# Patient Record
Sex: Female | Born: 1960 | Race: White | Hispanic: Yes | Marital: Married | Smoking: Never smoker
Health system: Southern US, Community
[De-identification: ages and names within clinical notes are randomized; demographics above are authoritative.]

## PROBLEM LIST (undated history)

## (undated) ENCOUNTER — Emergency Department (HOSPITAL_COMMUNITY): Admission: EM | Payer: Self-pay | Source: Home / Self Care

## (undated) DIAGNOSIS — E669 Obesity, unspecified: Secondary | ICD-10-CM

## (undated) DIAGNOSIS — I10 Essential (primary) hypertension: Secondary | ICD-10-CM

## (undated) DIAGNOSIS — B029 Zoster without complications: Secondary | ICD-10-CM

## (undated) HISTORY — PX: ABDOMINAL HYSTERECTOMY: SHX81

## (undated) HISTORY — PX: OVARIAN CYST REMOVAL: SHX89

## (undated) HISTORY — PX: APPENDECTOMY: SHX54

---

## 2000-05-18 ENCOUNTER — Encounter: Payer: Self-pay | Admitting: Emergency Medicine

## 2000-05-18 ENCOUNTER — Emergency Department (HOSPITAL_COMMUNITY): Admission: EM | Admit: 2000-05-18 | Discharge: 2000-05-18 | Payer: Self-pay | Admitting: Emergency Medicine

## 2000-05-25 ENCOUNTER — Emergency Department (HOSPITAL_COMMUNITY): Admission: EM | Admit: 2000-05-25 | Discharge: 2000-05-25 | Payer: Self-pay | Admitting: Emergency Medicine

## 2000-05-25 ENCOUNTER — Encounter: Payer: Self-pay | Admitting: Emergency Medicine

## 2001-03-15 ENCOUNTER — Emergency Department (HOSPITAL_COMMUNITY): Admission: EM | Admit: 2001-03-15 | Discharge: 2001-03-15 | Payer: Self-pay | Admitting: Emergency Medicine

## 2001-03-15 ENCOUNTER — Encounter: Payer: Self-pay | Admitting: Emergency Medicine

## 2002-05-19 ENCOUNTER — Emergency Department (HOSPITAL_COMMUNITY): Admission: EM | Admit: 2002-05-19 | Discharge: 2002-05-19 | Payer: Self-pay | Admitting: Emergency Medicine

## 2002-05-19 ENCOUNTER — Encounter: Payer: Self-pay | Admitting: Emergency Medicine

## 2004-01-05 ENCOUNTER — Emergency Department (HOSPITAL_COMMUNITY): Admission: EM | Admit: 2004-01-05 | Discharge: 2004-01-05 | Payer: Self-pay | Admitting: Emergency Medicine

## 2005-08-07 ENCOUNTER — Emergency Department (HOSPITAL_COMMUNITY): Admission: EM | Admit: 2005-08-07 | Discharge: 2005-08-08 | Payer: Self-pay | Admitting: *Deleted

## 2005-08-08 ENCOUNTER — Inpatient Hospital Stay (HOSPITAL_COMMUNITY): Admission: AD | Admit: 2005-08-08 | Discharge: 2005-08-08 | Payer: Self-pay | Admitting: Obstetrics and Gynecology

## 2005-08-09 ENCOUNTER — Ambulatory Visit (HOSPITAL_COMMUNITY): Admission: RE | Admit: 2005-08-09 | Discharge: 2005-08-09 | Payer: Self-pay | Admitting: Obstetrics and Gynecology

## 2005-08-09 ENCOUNTER — Ambulatory Visit: Payer: Self-pay | Admitting: Obstetrics and Gynecology

## 2005-08-20 ENCOUNTER — Encounter (INDEPENDENT_AMBULATORY_CARE_PROVIDER_SITE_OTHER): Payer: Self-pay | Admitting: Specialist

## 2005-08-20 ENCOUNTER — Inpatient Hospital Stay (HOSPITAL_COMMUNITY): Admission: RE | Admit: 2005-08-20 | Discharge: 2005-08-24 | Payer: Self-pay | Admitting: Obstetrics and Gynecology

## 2005-08-20 ENCOUNTER — Ambulatory Visit: Payer: Self-pay | Admitting: Obstetrics and Gynecology

## 2005-08-25 ENCOUNTER — Inpatient Hospital Stay (HOSPITAL_COMMUNITY): Admission: AD | Admit: 2005-08-25 | Discharge: 2005-08-25 | Payer: Self-pay | Admitting: Obstetrics & Gynecology

## 2005-08-27 ENCOUNTER — Observation Stay (HOSPITAL_COMMUNITY): Admission: AD | Admit: 2005-08-27 | Discharge: 2005-08-28 | Payer: Self-pay | Admitting: *Deleted

## 2005-08-27 ENCOUNTER — Ambulatory Visit: Payer: Self-pay | Admitting: Family Medicine

## 2005-09-04 ENCOUNTER — Ambulatory Visit: Payer: Self-pay | Admitting: Obstetrics and Gynecology

## 2005-10-02 ENCOUNTER — Ambulatory Visit: Payer: Self-pay | Admitting: Obstetrics and Gynecology

## 2005-10-16 ENCOUNTER — Ambulatory Visit: Payer: Self-pay | Admitting: Obstetrics and Gynecology

## 2006-03-19 ENCOUNTER — Ambulatory Visit: Payer: Self-pay | Admitting: Internal Medicine

## 2006-03-19 ENCOUNTER — Ambulatory Visit: Payer: Self-pay | Admitting: *Deleted

## 2008-03-21 ENCOUNTER — Emergency Department (HOSPITAL_COMMUNITY): Admission: EM | Admit: 2008-03-21 | Discharge: 2008-03-21 | Payer: Self-pay | Admitting: Emergency Medicine

## 2010-08-11 ENCOUNTER — Emergency Department (HOSPITAL_COMMUNITY): Admission: EM | Admit: 2010-08-11 | Discharge: 2010-08-11 | Payer: Self-pay | Admitting: Emergency Medicine

## 2011-04-24 NOTE — Op Note (Signed)
NAMEJake Jenkins           ACCOUNT NO.:  [id number]   MEDICAL RECORD NO.:  [id number]          PATIENT TYPE:  INP   LOCATION:  9315                          [facility]ACILITY:  [facility]   PHYSICIAN:  Phil D. Okey Dupre, M.D.     DATE OF BIRTH:  01/20/1963   DATE OF PROCEDURE:  08/20/2005  DATE OF DISCHARGE:                                 OPERATIVE REPORT   PROCEDURE:  Exploratory laparotomy with lysis of massive pelvic adhesions  and excision of pelvic complex cyst. Also repair of sigmoid colon defect by  Dr. Orson Slick which will be dictated by him.   PREOPERATIVE DIAGNOSIS:  Complex left ovarian cyst with normal right ovary.   POSTOPERATIVE DIAGNOSIS:  Massive pelvic adhesions, multiloculated complex  pelvic cyst, and inadvertent entry into the sigmoid colon.   SURGEON:  Javier Glazier. Okey Dupre, M.D. (with bowel repair by Lebron Conners, M.D.)   FIRST ASSISTANT:  Debbrah Alar, M.D.   ANESTHESIA:  General.   ESTIMATED BLOOD LOSS:  150 mL   POSTOPERATIVE CONDITION:  Satisfactory.   SPECIMEN TO PATHOLOGY:  Pelvic cyst and cyst walls.   OPERATIVE FINDINGS:  This patient who has undergone several laparotomies in  the past and finally hysterectomy for pelvic abscesses, who presented with  severe chronic pelvic pain with severe exacerbation and found to have a left  ovarian cyst complex by ultrasound. She was found to have massive pelvic  adhesions of bowel to bowel, bowel to omentum and bowel to cyst with a large  10 x 10 x 10 cystic mass extending from just below the bladder to the sacral  area and seen to be situated more to the right than to the left. During the  procedure we inadvertently entered the sigmoid colon with a rent about 2 cm.   DESCRIPTION OF PROCEDURE:  Under satisfactory general anesthesia the patient  in dorsal supine position, Foley catheter in urinary bladder, abdomen was  prepped and draped in the usual sterile manner and entered through a  Pfannenstiel incision. On entry  into the peritoneal cavity we found that  there were thick and massive adhesions to the anterior abdominal for which  we needed more exposure, so the rectus muscles were trimmed and the  peritoneum opened transversely as well as already opened vertically. Sharp  and blunt dissection was used in the beginning to try and isolate this  pelvic mass that was cystic, blue in coloration on the right and seemed to  be separated almost into two separate cysts, identified ovaries at this  point. So tediously by sharp and blunt dissection, the bowel and omentum was  dissected away from the thick adhesions attached to the pelvic mass. Once  this was freed up about 7 x 5 cm in diameter, the larger portion which was  thickly adhered to the smaller one seemed to be about 10 x 10 x 10. During  our dissection we sprang a small leak in the larger mass. Clear straw-  colored fluid. It was therefore decided to suck out the rest as dissection  had become so difficult going around injury that we made a  small incision in  the top of it and sucked, which measured approximately 300 mL. We then  removed the small cystic portion and sent for pathology the cystic wall. We  never did identify the infundibulopelvic ligaments nor either ovary after  the masses were removed, mainly because of the bowel that was appeared to  the pelvic walls. Therefore we are not sure whether this was truly ovarian.  If it was it would most likely be from the right-side and not the left as  described by an ultrasound. In examining the surgical site, we saw small  rent of 2 cm vertical in the sigmoid colon.  We got a Development worker, international aid, Dr.  Orson Slick and he kindly closed the rent for [place]. There was some small oozing in  the pelvis but no active pumping and seemed quite dry at the end of the  procedure. At this point, tape, instrument, sponge and needle counts  reported correct and the incision was closed with a typical Smead-Jones  closure using  0 PDS suture with a loop. Subcutaneous bleeders were  controlled with hot cautery. Each level was irrigated on exit. Nasogastric  tube was placed in the patient prior to her exiting the table. Skin edges  were approximated with skin staples. A dry sterile dressing was applied. The  patient tolerated the procedure well with approximately 150 mL blood loss  and was transferred recovery room in satisfactory condition.           ______________________________  Javier Glazier Okey Dupre, M.D.     PDR/MEDQ  D:  08/20/2005  T:  08/21/2005  Job:  [id number]

## 2011-04-24 NOTE — Group Therapy Note (Signed)
[name] NO.:  [id number]   MEDICAL RECORD NO.:  [id number]          PATIENT TYPE:  WOC   LOCATION:  [facility]                   FACILITY:  [facility]   PHYSICIAN:  Argentina Donovan, MD        DATE OF BIRTH:  01/20/1963   DATE OF SERVICE:  08/09/2005                                    CLINIC NOTE   REASON FOR VISIT:  This is a patient that was sent to [place] by Dr. Tresa Res. She  is a 50 year old Hispanic nulligravida who has a history of total abdominal  hysterectomy after exploratory laparotomy and pelvic abscess. This was done  in 1986. She has been in fairly good health but is on no medication, and was  seen in the emergency room because of abdominal pain, diagnosed with a 15 cm  complex ovarian cyst on the left. We have discussed with the patient and she  desires both ovaries removed if possible. The patient has no other medical  problems noted, has no allergies, and is on no medication on a regular basis  except the Percocet that she has been on since being seen in the emergency  room 2 days ago. She is 5 feet tall, weighs 175 pounds. She has a blood  pressure 166/101, a temperature of 99.2, and 79 pulse.   We are going to schedule the patient for exploratory laparotomy, bilateral  oophorectomy. We will get a comprehensive and a basic metabolic panel today,  as well as a CA-125, and we will schedule the patient as soon as possible  for surgery. I am putting her on Percocet 5 mg #50. We have encouraged her  to supplement that with ibuprofen.   IMPRESSION:  Abdominal pain secondary to large left adnexal mass.           ______________________________  Argentina Donovan, MD     PR/MEDQ  D:  08/09/2005  T:  08/10/2005  Job:  [id number]

## 2011-04-24 NOTE — Discharge Summary (Signed)
NAMEJake Michaelis           ACCOUNT NO.:  [id number]   MEDICAL RECORD NO.:  [id number]          PATIENT TYPE:   LOCATION:                                 FACILITY:   PHYSICIAN:  Tanya S. Shawnie Pons, M.D.        DATE OF BIRTH:   DATE OF ADMISSION:  08/27/2005  DATE OF DISCHARGE:  08/28/2005                                 DISCHARGE SUMMARY   ADMISSION DIAGNOSES:  1.  Abdominal pain.  2.  Oozing serosanguineous fluid from the incision.  3.  Anemia.   DISCHARGE DIAGNOSES:  1.  Abdominal pain.  2.  Serosanguineous fluid oozing from abdominal, incision.  3.  Anemia.   DISCHARGE MEDICATIONS:  1.  Ciprofloxacin (500 mg) 1 tablet p.o. q.12h.  2.  Metronidazole (500 mg) 1 tablet p.o. q.12h.  3.  Xanax (0.5 mg) 1 tablet p.o. q.h.s. to sleep.  4.  Ibuprofen (800 mg) 1 tablet p.o. q.8h. p.r.n. pain.  5.  Colace (100 mg) 1 tablet p.o. b.i.d. p.r.n. constipation.  6.  Iron (325 mg) 1 tablet p.o. b.i.d.   FOLLOW UP APPOINTMENTS:  The patient has a follow-up appointment with Dr.  Okey Dupre at [facility] on September 04, 2005.  The patient also  has a follow-up appointment at [facility] on August 29, 2005.   INSTRUCTIONS:  1.  Increase vegetables, fruits, and fluid intake to improve constipation.  2.  Keep the wound clean with peroxide once a day.   BRIEF HISTORY, PHYSICAL EXAMINATION, AND LABORATORY DATA ON ADMISSION:  A 50-  year-old Hispanic female postoperative day #8 exploratory laparoscopy with  lysis of adhesions, repair of colon sigmoid, removal of ovarian cyst came to  the maternal admission unit complaining of abdominal pain and an increase of  leaking fluids from the incision.  This patient was also seen on August 25, 2005, at the maternal admission unit complaining of abdominal pain and  constipation.  An abdominal CT scan was performed on August 25, 2005,  which showed no changes since exploratory laparoscopy, no amount of fluid  in  the right paracolic gutter, otherwise unremarkable CT examination of the  abdomen.  A pelvis CT also performed on August 25, 2005, showed several  areas of soft tissue thickening, possibly secondary to postoperative  hematoma or tumor.  There were no abscesses.  There were a few dots of free  air, which were likely postoperative.  The inflammatory changes involving  the sigmoid colon were observed and these were secondary to surrounding  edema in the fat.  On August 25, 2005, the patient was afebrile.  Abdominal pain resolved with enema since the patient was unable to move her  bowels for approximately 2 to 3 days.  On the day of admission, the patient  complained of increased abdominal pain with leaking fluid from incision and  low-grade fever.  The patient denied nausea or vomiting.  Physical  examination on admission showed temperature of 100.1, pulse of 97,  respiratory rate of 20, blood pressure of 89/62.  In general, the patient  was awake and alert.  Physical examination was unremarkable except for  abdominal exam that showed oozing serosanguineous fluid from the low  abdominal incisions.  Edema and hematoma on the incision are noted.  Abdomen  was diffusely tender, +/- rebound, soft, moderately distended.  Laboratory  data on admission showed WBC of 14.7, hemoglobin of 8.8, hematocrit of  25.9%, platelets 523,000.  BMP:  Sodium 137, potassium 3, chloride 91, CO2  29, BUN 6, creatinine 0.8, glucose 136.  SGOT 25, SGPT 27.  Calcium 8.5,  magnesium 2.3.  the patient was admitted for 23-hour observation.   HOSPITAL COURSE:  1.  Abdominal pain:  X-ray of the abdomen ruled out obstruction or fre air.      Abdominal pain was improved with bowel movement.  Abdominal pain was      most likely secondary to constipation and adhesions/scar tissue      secondary to multiple abdominal surgical procedures.  By the day of      discharge, the patient is clinically stable, afebrile, and  denying      abdominal pain, nausea, or vomiting.  The patient was instructed to      increase vegetables, fruits, and fluid intake to avoid constipation.      The patient was also prescribed Colace p.r.n. constipation.  The patient      is to continue Cipro 500 mg 1 tablet p.o. b.i.d. and Flagyl 500 mg 1      tablet p.o. b.i.d. to complete a course of 7 days.  This medication was      started on August 25, 2005.  The patient has a follow-up appointment      with Dr. Okey Dupre on September 04, 2005.  2.  Serosanguineous oozing fluid from incisions:  This is likely secondary      to hematoma postoperatively.  Dr. Okey Dupre examined the patient and      explained that this oozing will continue for several days secondary to      some edema and hematoma infiltration of the incision area.  The patient      is to keep the incisions clean with peroxide once a day.  3.  Anemia:  Secondary to blood loss during exploratory laparoscopy.      Continue iron 325 mg 1 tablet p.o. b.i.d.  4.  Mental health:  The patient has a follow-up appointment at [facility] secondary to hearing voices.  This issue was      assessed during the visit to the maternal admission unit on August 25, 2005.  Behavioral health was consulted and the patient was      evaluated.  The patient is to be followed as an outpatient at [facility].  Currently, the patient is on Xanax 0.5 mg 1 tablet p.o. q.h.s.      to sleep.  The patient does have a history of suicidal attempt 1 year      ago and she also used to hear voices 1 year ago.  During the      hospitalization, the patient stated that the voices are not bothering      her as much as they did on August 25, 2005; although she still      continued to hear the voices.  Voices are not giving her commands.  The      patient denies suicidal thoughts/plans.      Henri Medal, MD  ______________________________  Shelbie Proctor Shawnie Pons,  M.D.    Lendon Colonel  D:  08/28/2005  T:  08/28/2005  Job:  [id number]

## 2011-04-24 NOTE — Op Note (Signed)
[name] NO.:  [id number]   MEDICAL RECORD NO.:  [id number]          PATIENT TYPE:  INP   LOCATION:  9315                          [facility]ILITY[facility]   PHYSICIAN:  Lorre Munroe., M.D.DATE OF BIRTH:  01/20/1963   DATE OF PROCEDURE:  08/20/2005  DATE OF DISCHARGE:                                 OPERATIVE REPORT   CLINICAL NOTE:  The patient is a 50 year old female who is undergoing  resection of a cystic pelvic mass by Dr. Okey Dupre.  There were dense adhesions  involving the bowel.  After resection, Dr. Okey Dupre noted a hole in the anterior  rectum.  The patient is not known to have any chronic gastrointestinal  problems.  There has been no fecal contamination noted by Dr. Okey Dupre prior to  my coming to the operating room.   DESCRIPTION OF PROCEDURE:  I scrubbed at the request of Dr. Okey Dupre and noted  the area which he had marked with Babcock clamp as representing perforation.  There was indeed a perforation of the anterior surface of the rectosigmoid  about 2-3 cm in diameter.  There was a very clean opening with fresh-  appearing, well-vascularized edges of the bowel.  The caliber of the bowel  was large at that point.  There was no evidence of any fecal contamination  whatsoever and the amount of stool in the rectum and proximal bowel was  minimal.  There was no distention of the bowel.  Distally, the pelvis was  pretty much obliterated by adhesions.  The bladder flap appeared to be  intact and Foley balloon could be palpated in the bladder.  No bleeding was  evident.  I have decided that with this appearance that a primary repair was  warranted.   I closed the hole transversely by full-thickness, running 3-0 Vicryl suture  and noted that that produced no evident stenosis of the bowel.  I then  reinforced that with Lembert 3-0 silk sutures through the seromuscular  layers of the bowel all the way across.  Again, I noted that good generous  lumen of the rectum  was maintained.  After irrigation and checking for  hemostasis, I scrubbed out and Dr. Okey Dupre completed the operation.      Lorre Munroe., M.D.  Electronically Signed     WB/MEDQ  D:  08/21/2005  T:  08/21/2005  Job:  [id number]   cc:   Javier Glazier. Okey Dupre, M.D.  Fax: [phone]

## 2011-04-24 NOTE — Discharge Summary (Signed)
NAMEJake Michaelis           ACCOUNT NO.:  [id number]   MEDICAL RECORD NO.:  [id number]          PATIENT TYPE:  INP   LOCATION:  9315                          [facility]   PHYSICIAN:  Phil D. Okey Dupre, M.D.     DATE OF BIRTH:  01/20/1963   DATE OF ADMISSION:  08/20/2005  DATE OF DISCHARGE:  08/24/2005                                 DISCHARGE SUMMARY   On the day of admission the patient was underwent exploratory laparotomy  with lysis of massive pelvic conditions and bilateral oophorectomy.  During  the procedure with the massive adhesions, the colon was inadvertently  entered and the general surgeon came over to repair the 2 cm opening in the  colon.  The patient recovered without significant problems related to the  surgery.  She has multiple complaints which she had prior to surgery, to  spastic colon, psychological complaints possibly related to her family  situation.  We underwent long consultations with her with a translator  present.  She was discharged to be followed up in two weeks in the GYN  clinic and referred to [facility] for psychological analysis and  treatment.  The patient had a relatively unremarkable postoperative course,  had vital signs stable.  Was given discharge instructions as to diet,  activity and follow-up, and has been discharged on Percocet.           ______________________________  Javier Glazier. Okey Dupre, M.D.     PDR/MEDQ  D:  11/07/2005  T:  11/07/2005  Job:  [id number]

## 2011-08-11 ENCOUNTER — Ambulatory Visit (INDEPENDENT_AMBULATORY_CARE_PROVIDER_SITE_OTHER): Payer: Self-pay

## 2011-08-11 ENCOUNTER — Inpatient Hospital Stay (INDEPENDENT_AMBULATORY_CARE_PROVIDER_SITE_OTHER)
Admission: RE | Admit: 2011-08-11 | Discharge: 2011-08-11 | Disposition: A | Discharge status: Home / Self Care | Payer: Self-pay | Source: Ambulatory Visit | Attending: Emergency Medicine | Admitting: Emergency Medicine

## 2011-08-11 DIAGNOSIS — IMO0002 Reserved for concepts with insufficient information to code with codable children: Secondary | ICD-10-CM

## 2011-08-11 DIAGNOSIS — S20229A Contusion of unspecified back wall of thorax, initial encounter: Secondary | ICD-10-CM

## 2011-08-11 DIAGNOSIS — M799 Soft tissue disorder, unspecified: Secondary | ICD-10-CM

## 2011-08-11 LAB — POCT URINALYSIS DIP (DEVICE)
Hgb urine dipstick: NEGATIVE
Ketones, ur: NEGATIVE mg/dL
Protein, ur: NEGATIVE mg/dL
Specific Gravity, Urine: 1.02 (ref 1.005–1.030)
pH: 7 (ref 5.0–8.0)

## 2013-01-29 ENCOUNTER — Other Ambulatory Visit: Payer: Self-pay

## 2013-01-29 ENCOUNTER — Emergency Department (HOSPITAL_COMMUNITY): Payer: Self-pay

## 2013-01-29 ENCOUNTER — Inpatient Hospital Stay (HOSPITAL_COMMUNITY)
Admission: EM | Admit: 2013-01-29 | Discharge: 2013-02-02 | DRG: 202 | Disposition: A | Discharge status: Home / Self Care | Payer: MEDICAID | Attending: Internal Medicine | Admitting: Internal Medicine

## 2013-01-29 ENCOUNTER — Inpatient Hospital Stay (HOSPITAL_COMMUNITY): Payer: Self-pay

## 2013-01-29 ENCOUNTER — Encounter (HOSPITAL_COMMUNITY): Payer: Self-pay

## 2013-01-29 DIAGNOSIS — R0609 Other forms of dyspnea: Secondary | ICD-10-CM | POA: Diagnosis present

## 2013-01-29 DIAGNOSIS — E669 Obesity, unspecified: Secondary | ICD-10-CM | POA: Diagnosis present

## 2013-01-29 DIAGNOSIS — R071 Chest pain on breathing: Secondary | ICD-10-CM | POA: Diagnosis present

## 2013-01-29 DIAGNOSIS — J209 Acute bronchitis, unspecified: Principal | ICD-10-CM | POA: Diagnosis present

## 2013-01-29 DIAGNOSIS — J40 Bronchitis, not specified as acute or chronic: Secondary | ICD-10-CM

## 2013-01-29 DIAGNOSIS — I509 Heart failure, unspecified: Secondary | ICD-10-CM

## 2013-01-29 DIAGNOSIS — R9401 Abnormal electroencephalogram [EEG]: Secondary | ICD-10-CM | POA: Diagnosis present

## 2013-01-29 DIAGNOSIS — J189 Pneumonia, unspecified organism: Secondary | ICD-10-CM | POA: Diagnosis present

## 2013-01-29 DIAGNOSIS — R0989 Other specified symptoms and signs involving the circulatory and respiratory systems: Secondary | ICD-10-CM | POA: Diagnosis present

## 2013-01-29 DIAGNOSIS — R079 Chest pain, unspecified: Secondary | ICD-10-CM

## 2013-01-29 DIAGNOSIS — E785 Hyperlipidemia, unspecified: Secondary | ICD-10-CM

## 2013-01-29 DIAGNOSIS — R062 Wheezing: Secondary | ICD-10-CM

## 2013-01-29 DIAGNOSIS — M79609 Pain in unspecified limb: Secondary | ICD-10-CM

## 2013-01-29 DIAGNOSIS — R0602 Shortness of breath: Secondary | ICD-10-CM

## 2013-01-29 DIAGNOSIS — M25569 Pain in unspecified knee: Secondary | ICD-10-CM

## 2013-01-29 DIAGNOSIS — I10 Essential (primary) hypertension: Secondary | ICD-10-CM

## 2013-01-29 HISTORY — DX: Obesity, unspecified: E66.9

## 2013-01-29 HISTORY — DX: Essential (primary) hypertension: I10

## 2013-01-29 LAB — BASIC METABOLIC PANEL
BUN: 11 mg/dL (ref 6–23)
Creatinine, Ser: 0.65 mg/dL (ref 0.50–1.10)

## 2013-01-29 LAB — LIPID PANEL
HDL: 55 mg/dL (ref >39)
LDL Cholesterol: 160 mg/dL — ABNORMAL HIGH (ref 0–99)
Total CHOL/HDL Ratio: 4.2 RATIO
VLDL: 17 mg/dL (ref 0–40)

## 2013-01-29 LAB — URINE MICROSCOPIC-ADD ON

## 2013-01-29 LAB — TROPONIN I: Troponin I: <0.3 ng/mL (ref <0.30)

## 2013-01-29 LAB — CBC
HCT: 41.2 % (ref 36.0–46.0)
MCHC: 35.4 g/dL (ref 30.0–36.0)
MCV: 82.6 fL (ref 78.0–100.0)
RDW: 12.8 % (ref 11.5–15.5)

## 2013-01-29 LAB — URINALYSIS, ROUTINE W REFLEX MICROSCOPIC
Glucose, UA: NEGATIVE mg/dL
Leukocytes, UA: NEGATIVE
Protein, ur: 100 mg/dL — AB
Specific Gravity, Urine: 1.021 (ref 1.005–1.030)

## 2013-01-29 LAB — PRO B NATRIURETIC PEPTIDE: Pro B Natriuretic peptide (BNP): 864.2 pg/mL — ABNORMAL HIGH (ref 0–125)

## 2013-01-29 LAB — POCT I-STAT TROPONIN I: Troponin i, poc: 0.03 ng/mL (ref 0.00–0.08)

## 2013-01-29 MED ORDER — PREDNISONE 20 MG PO TABS
60.0000 mg | ORAL_TABLET | Freq: Once | ORAL | Status: AC
  Administered 2013-01-29: 60 mg via ORAL
  Filled 2013-01-29: qty 3

## 2013-01-29 MED ORDER — HEPARIN BOLUS VIA INFUSION
4000.0000 [IU] | Freq: Once | INTRAVENOUS | Status: AC
  Administered 2013-01-29: 4000 [IU] via INTRAVENOUS
  Filled 2013-01-29: qty 4000

## 2013-01-29 MED ORDER — ALBUTEROL SULFATE (5 MG/ML) 0.5% IN NEBU
5.0000 mg | INHALATION_SOLUTION | RESPIRATORY_TRACT | Status: DC
  Filled 2013-01-29: qty 0.5

## 2013-01-29 MED ORDER — OXYCODONE-ACETAMINOPHEN 5-325 MG PO TABS
1.0000 | ORAL_TABLET | ORAL | Status: DC | PRN
  Administered 2013-01-29: 2 via ORAL
  Administered 2013-01-31 – 2013-02-01 (×2): 1 via ORAL
  Filled 2013-01-29 (×2): qty 1
  Filled 2013-01-29 (×2): qty 2

## 2013-01-29 MED ORDER — ALBUTEROL SULFATE (5 MG/ML) 0.5% IN NEBU
5.0000 mg | INHALATION_SOLUTION | Freq: Once | RESPIRATORY_TRACT | Status: AC
  Administered 2013-01-29: 5 mg via RESPIRATORY_TRACT
  Filled 2013-01-29: qty 1

## 2013-01-29 MED ORDER — ALBUTEROL (5 MG/ML) CONTINUOUS INHALATION SOLN
10.0000 mg/h | INHALATION_SOLUTION | Freq: Once | RESPIRATORY_TRACT | Status: AC
  Administered 2013-01-29: 10 mg/h via RESPIRATORY_TRACT
  Filled 2013-01-29: qty 20

## 2013-01-29 MED ORDER — ASPIRIN 81 MG PO CHEW
324.0000 mg | CHEWABLE_TABLET | Freq: Once | ORAL | Status: AC
  Administered 2013-01-29: 324 mg via ORAL
  Filled 2013-01-29: qty 4

## 2013-01-29 MED ORDER — POTASSIUM CHLORIDE CRYS ER 20 MEQ PO TBCR
40.0000 meq | EXTENDED_RELEASE_TABLET | Freq: Once | ORAL | Status: AC
  Administered 2013-01-29: 40 meq via ORAL
  Filled 2013-01-29: qty 2

## 2013-01-29 MED ORDER — HEPARIN (PORCINE) IN NACL 100-0.45 UNIT/ML-% IJ SOLN
900.0000 [IU]/h | INTRAMUSCULAR | Status: DC
  Administered 2013-01-29: 900 [IU]/h via INTRAVENOUS
  Filled 2013-01-29: qty 250

## 2013-01-29 MED ORDER — IPRATROPIUM BROMIDE 0.02 % IN SOLN
0.5000 mg | Freq: Once | RESPIRATORY_TRACT | Status: AC
  Administered 2013-01-29: 0.5 mg via RESPIRATORY_TRACT
  Filled 2013-01-29: qty 2.5

## 2013-01-29 MED ORDER — HYDRALAZINE HCL 20 MG/ML IJ SOLN
10.0000 mg | INTRAMUSCULAR | Status: DC | PRN
  Filled 2013-01-29 (×2): qty 0.5

## 2013-01-29 MED ORDER — POTASSIUM CHLORIDE CRYS ER 20 MEQ PO TBCR
40.0000 meq | EXTENDED_RELEASE_TABLET | Freq: Two times a day (BID) | ORAL | Status: AC
  Administered 2013-01-29 – 2013-01-30 (×2): 40 meq via ORAL
  Filled 2013-01-29 (×2): qty 2

## 2013-01-29 MED ORDER — ENOXAPARIN SODIUM 40 MG/0.4ML ~~LOC~~ SOLN
40.0000 mg | SUBCUTANEOUS | Status: DC
  Filled 2013-01-29: qty 0.4

## 2013-01-29 MED ORDER — ONDANSETRON HCL 4 MG PO TABS: 4.0000 mg | ORAL_TABLET | Freq: Four times a day (QID) | ORAL | Status: DC | PRN

## 2013-01-29 MED ORDER — ONDANSETRON HCL 4 MG/2ML IJ SOLN: 4.0000 mg | Freq: Four times a day (QID) | INTRAMUSCULAR | Status: DC | PRN

## 2013-01-29 MED ORDER — ACETAMINOPHEN 650 MG RE SUPP: 650.0000 mg | Freq: Four times a day (QID) | RECTAL | Status: DC | PRN

## 2013-01-29 MED ORDER — HYDROCHLOROTHIAZIDE 12.5 MG PO CAPS
12.5000 mg | ORAL_CAPSULE | Freq: Every day | ORAL | Status: DC
  Filled 2013-01-29: qty 1

## 2013-01-29 MED ORDER — SODIUM CHLORIDE 0.9 % IJ SOLN
3.0000 mL | Freq: Two times a day (BID) | INTRAMUSCULAR | Status: DC
  Administered 2013-01-29 – 2013-02-02 (×8): 3 mL via INTRAVENOUS

## 2013-01-29 MED ORDER — IPRATROPIUM BROMIDE 0.02 % IN SOLN
0.5000 mg | RESPIRATORY_TRACT | Status: DC
  Filled 2013-01-29: qty 2.5

## 2013-01-29 MED ORDER — FUROSEMIDE 10 MG/ML IJ SOLN
40.0000 mg | Freq: Once | INTRAMUSCULAR | Status: AC
  Administered 2013-01-29: 40 mg via INTRAVENOUS
  Filled 2013-01-29: qty 4

## 2013-01-29 MED ORDER — ENOXAPARIN SODIUM 40 MG/0.4ML ~~LOC~~ SOLN
40.0000 mg | SUBCUTANEOUS | Status: DC
  Administered 2013-01-30 – 2013-02-02 (×4): 40 mg via SUBCUTANEOUS
  Filled 2013-01-29 (×4): qty 0.4

## 2013-01-29 MED ORDER — LEVOFLOXACIN IN D5W 500 MG/100ML IV SOLN
500.0000 mg | Freq: Once | INTRAVENOUS | Status: AC
  Administered 2013-01-29: 500 mg via INTRAVENOUS
  Filled 2013-01-29: qty 100

## 2013-01-29 MED ORDER — ACETAMINOPHEN 325 MG PO TABS
650.0000 mg | ORAL_TABLET | Freq: Four times a day (QID) | ORAL | Status: DC | PRN
  Administered 2013-01-31 – 2013-02-02 (×3): 650 mg via ORAL
  Filled 2013-01-29 (×3): qty 2

## 2013-01-29 MED ORDER — ATORVASTATIN CALCIUM 80 MG PO TABS
80.0000 mg | ORAL_TABLET | Freq: Every day | ORAL | Status: DC
  Administered 2013-01-29 – 2013-02-01 (×4): 80 mg via ORAL
  Filled 2013-01-29 (×5): qty 1

## 2013-01-29 NOTE — ED Provider Notes (Signed)
CC of SOB, has had for 4 days, gradually worsening, no sig PMH and doesn't see a doctor.  On exam pt has diffuse wheezing and increased WOB, given nebs in ED with improvement, no obvious infiltrate on CXR, admitted to unassigned medicine.  Medical screening examination/treatment/procedure(s) were conducted as a shared visit with non-physician practitioner(s) and myself.  I personally evaluated the patient during the encounter    Vida Roller, MD 01/29/13 1434

## 2013-01-29 NOTE — H&P (Addendum)
Internal Medicine Attending Admission Note Date: 01/29/2013  Patient name: Felicia Jenkins Medical record number: [id number] Date of birth: 1961/10/18 Age: 52 y.o. Gender: female  I saw and evaluated the patient. I reviewed the resident's note and I agree with the resident's findings and plan as documented in the resident's note.  Chief Complaint(s): Chest pain, knee pain and shortness of breath  History - key components related to admission: Patient is a 52 year old Spanish-speaking woman with no previous medical history. Most of the history has been collected from the chart and from the residents who presented the patient to me as my communication was limited by the language barrier.  She did complain of chest pain to me which was much less than what she came in with. She described the pain as present in the middle of the chest, sharp in character, 2-3/10 at this time, no radiation, worse with walking and improved with rest. Chest pain was associated with shortness of breath which has progressively been getting worse over last 2-3 days. Patient is unable to lie flat at night as lying flat makes the shortness of breath worse.  Patient also complained of left knee pain which started on Sunday.  15 point review of systems was otherwise negative.  Physical Exam - key components related to admission:  Filed Vitals:   01/29/13 1000 01/29/13 1100 01/29/13 1154 01/29/13 1305  BP: 169/83 160/97 144/57 157/62  Pulse: 101 103 81 79  Temp:    98.5 F (36.9 C)  TempSrc:    Oral  Resp: 22 18 14 20   Height:    5\' 2"  (1.575 m)  Weight:    217 lb 9.5 oz (98.7 kg)  SpO2: 100% 97% 98% 98%   Physical Exam: General: Vital signs reviewed and noted. Well-developed obese abdomen, in no acute distress; alert, appropriate and cooperative throughout examination.  Head: Normocephalic, atraumatic.  Eyes: PERRL, EOMI, No signs of anemia or jaundince.  Nose: Mucous membranes moist, not inflammed,  nonerythematous.  Throat: Oropharynx nonerythematous, no exudate appreciated.   Neck: No deformities, masses, or tenderness noted.Supple, No carotid Bruits, no JVD.  Lungs:  Normal respiratory effort. Clear to auscultation BL without crackles or wheezes.  Heart: RRR. S1 and S2 normal without gallop, murmur, or rubs.  Abdomen:  BS normoactive. Soft, Nondistended, non-tender.  No masses or organomegaly.  Extremities: No pretibial edema.left knee tender to touch but no swellings/redness noted   Neurologic: A&O X3, CN II - XII are grossly intact. Motor strength is 5/5 in the all 4 extremities, Sensations intact to light touch, Cerebellar signs negative.  Skin: No visible rashes, scars.     Lab results:   Basic Metabolic Panel:  Recent Labs  30/86/57 0734  NA 140  K 3.3*  CL 100  CO2 29  GLUCOSE 126*  BUN 11  CREATININE 0.65  CALCIUM 9.0   CBC:  Recent Labs  01/29/13 0734  WBC 8.3  HGB 14.6  HCT 41.2  MCV 82.6  PLT 187   Cardiac Enzymes:  Recent Labs  01/29/13 1531  TROPONINI <0.30   Fasting Lipid Panel:  Recent Labs  01/29/13 1509  CHOL 232*  HDL 55  LDLCALC 160*  TRIG 83  CHOLHDL 4.2    Imaging results:  Dg Chest 2 View  01/29/2013  *RADIOLOGY REPORT*  Clinical Data: Chest pain, shortness of breath and cough.  CHEST - 2 VIEW  Comparison: No priors.  Findings: Lung volumes are normal.  There is an ill-defined opacity in  the medial aspect of the right lung base partially obscuring the medial right hemidiaphragm, concerning for an area of atelectasis and/or consolidation.  Lungs are otherwise clear.  No pleural effusions.  Mild pulmonary venous congestion without frank pulmonary edema.  Heart size is borderline enlarged, likely accentuated by patient rotation to the left which also distorts upper mediastinal contours.  IMPRESSION: 1.  Opacity in the medial aspect of the right base concerning for an area of atelectasis and/or consolidation in the right lower  lobe.  Correlation for signs and symptoms of recent aspiration is recommended.   Original Report Authenticated By: Trudie Reed, M.D.     Other results: EKG: 98 beats per minute, normal axis, left ventricle her hypertrophy, T-wave inversions in lead 1 and aVL and T-wave inversions noted in lateral precordial leads 5 and 6. Patient also has ST depression in leads V5 and V6.   No previous EKG in the system for comparison  Assessment & Plan by Problem:  Principal Problem:   Acute coronary syndrome Active Problems:   CHF, acute NOS(non specific)   HTN (hypertension)   Knee pain  52 year old woman with past medical history most significant for most likely uncontrolled hypertension who has not seen a physician in the past comes in with acute onset chest pain, progressive shortness of breath and EKG changes. Patient has no previous EKG in the system to compare. Given her history, physical exam and available labs and other studies suggest that patient may have new onset congestive heart failure as suggested by her elevated proBNP level. Chest pain and progressive shortness of breath in the setting of EKG changes makes acute coronary syndrome very likely. I believe that patient should be started on heparin drip, aspirin and statin until we have 3 negative troponins. The likelihood of active ACS is low given that the pain has been present for last 4 days now but the shortness of breath and EKG changes are concerning. Obtaining a 2-D echocardiogram would be helpful to note for any wall motion abnormalities. If patient does have reduced ejection fraction, she may benefit from a cardiology evaluation for new onset congestive heart failure. In the meantime we will diurese the patient and treat as noted above.  Patient was started on Levaquin in the ER as the chest x-ray was read as possible atelectasis or infiltrate. I do not believe that the history and labs are consistent with pneumonia and would suggest  to discontinue Levaquin at this time.  The patient also has left knee pain. The cause of left knee pain is unclear at this time but does not seem to be acutely inflamed. We'll obtain x-rays of the knee and followup on the results.  Rest of the medical management as per resident's note.  Lars Mage MD Faculty-Internal Medicine Residency Program Pager [phone]

## 2013-01-29 NOTE — ED Notes (Signed)
Pt taken to vascular lab.

## 2013-01-29 NOTE — ED Notes (Signed)
The patient states that she has had chest pain associated with respirations since Sunday.  She states the symptoms get worse when she lies down or ambulates.

## 2013-01-29 NOTE — ED Notes (Signed)
Admit Doctor at bedside.  

## 2013-01-29 NOTE — Progress Notes (Signed)
Utilization Review Completed Feliciano Wynter J. Breslin Burklow, RN, BSN, NCM [phone]  

## 2013-01-29 NOTE — Progress Notes (Signed)
ANTICOAGULATION CONSULT NOTE - Initial Consult  Pharmacy Consult for Heparin Indication: chest pain/ACS  Allergies  Allergen Reactions  . Penicillins Shortness Of Breath    Patient Measurements: Height: 5\' 2"  (157.5 cm) Weight: 217 lb 9.5 oz (98.7 kg) (scale A) IBW/kg (Calculated) : 50.1 Heparin Dosing Weight: 75  Vital Signs: Temp: 98.5 F (36.9 C) (02/27 1305) Temp src: Oral (02/27 1305) BP: 157/62 mmHg (02/27 1305) Pulse Rate: 79 (02/27 1305)  Labs:  Recent Labs  01/29/13 0734 01/29/13 1531  HGB 14.6  --   HCT 41.2  --   PLT 187  --   CREATININE 0.65  --   TROPONINI  --  <0.30    Estimated Creatinine Clearance: 89.2 ml/min (by C-G formula based on Cr of 0.65).   Medical History: Past Medical History  Diagnosis Date  . Chest pain     Medications:  Scheduled:  . [COMPLETED] albuterol  5 mg Nebulization Once  . albuterol  5 mg Nebulization Q4H  . [COMPLETED] albuterol  10 mg/hr Nebulization Once  . [COMPLETED] aspirin  324 mg Oral Once  . atorvastatin  80 mg Oral q1800  . enoxaparin (LOVENOX) injection  40 mg Subcutaneous Q24H  . [COMPLETED] furosemide  40 mg Intravenous Once  . [COMPLETED] ipratropium  0.5 mg Nebulization Once  . ipratropium  0.5 mg Nebulization Q4H  . [COMPLETED] levofloxacin (LEVAQUIN) IV  500 mg Intravenous Once  . [COMPLETED] potassium chloride  40 mEq Oral Once  . [COMPLETED] predniSONE  60 mg Oral Once  . sodium chloride  3 mL Intravenous Q12H  . [DISCONTINUED] hydrochlorothiazide  12.5 mg Oral Daily    Assessment: 52yo female with chest pain, to start heparin for R/O ACS.  There are no baseline coags, Hg/pltc are wnl.  Per communication through translator, pt has no hx of bleeding problems.  She has an order for Lovenox for VTE px, but has not received a dose yet.  Goal of Therapy:  Heparin level 0.3-0.7 units/ml Monitor platelets by anticoagulation protocol: Yes   Plan:  1.  D/C Lovenox 2.  Heparin 4000 units IV x 1,  900 units/hr 3.  Heparin level 6 hours 4.  Daily heparin level and CBC  Marisue Humble, PharmD Clinical Pharmacist [facility]

## 2013-01-29 NOTE — H&P (Signed)
Hospital Admission Note Date: 01/29/2013  Patient name: Felicia Jenkins Medical record number: [id number] Date of birth: December 01, 1961 Age: 52 y.o. Gender: female PCP: No primary provider on file.  Medical Service: Internal medicine teaching service  Attending physician: Dr. Eben Burow    1st Contact: Collier Bullock    Pager: [phone] 2nd Contact: Manson Passey    Pager: [phone] After 5 pm or weekends: 1st Contact:      Pager: [phone] 2nd Contact:      Pager: [phone]  Chief Complaint: Left knee pain, and chest pain, shortness of breath  History of Present Illness: Patient is a 52 year old Spanish speaking woman with no previous medical history who comes to the hospital with chief complaint of severe new onset left knee pain and chest pain and shortness of breath. These problems began last Sunday-  no clear inciting event.  She does report that she feels like she had a flu - but denies any fever, chills, nausea, vomiting, abdominal pain, diarrhea. The chest pain and shortness of breath began on Sunday. Chest pain is in the mid chest without any radiation. No palpitations. Shortness of breath is at rest and gets worse with any activity, lying down, walking. She denies any night sweats, recent weight loss. Her only home medication is ibuprofen.  Meds: Current Outpatient Rx  Name  Route  Sig  Dispense  Refill  . ibuprofen (ADVIL,MOTRIN) 200 MG tablet   Oral   Take 200 mg by mouth every 6 (six) hours as needed for pain.           Allergies: Allergies as of 01/29/2013 - Review Complete 01/29/2013  Allergen Reaction Noted  . Penicillins Shortness Of Breath 01/29/2013   No past medical history on file. Past Surgical History  Procedure Laterality Date  . Appendectomy    . Abdominal hysterectomy    . Ovarian cyst removal     Family History  Problem Relation Age of Onset  . Arthritis Mother   . Leukemia Father    History   Social History  . Marital Status: Married    Spouse Name: N/A    Number of Children: N/A  . Years of Education: N/A   Occupational History  . Not on file.   Social History Main Topics  . Smoking status: Never Smoker   . Smokeless tobacco: Never Used  . Alcohol Use: No  . Drug Use: No  . Sexually Active: Not on file   Other Topics Concern  . Not on file   Social History Narrative  . No narrative on file    Review of Systems: As per history of present illness, all other systems reviewed and negative.  Physical Exam: Blood pressure 169/83, pulse 101, temperature 98.6 F (37 C), temperature source Oral, resp. rate 22, SpO2 100.00%. Constitutional: Vital signs reviewed.  Patient is a well-developed and well-nourished in acute distress due to shortness of breath but cooperative with exam. Alert and oriented x3.  Head: Normocephalic and atraumatic Mouth: no erythema or exudates, MMM Eyes: PERRL, EOMI, conjunctivae normal, No scleral icterus.  Neck: Supple, Trachea midline normal ROM, No JVD  Cardiovascular: RRR, S1 normal, S2 normal, no MRG Pulmonary/Chest: Good air entry bilaterally. Mild to moderate Expiratory wheezes diffusely bilaterally. Abdominal: Soft. Non-tender, non-distended, bowel sounds are normal.  Musculoskeletal: Right knee swelling without any redness or discharge. Tender to touch. No increased warmth. Neurological: A&O x3, Strength is normal and symmetric bilaterally, cranial nerve II-XII are grossly intact, no focal motor deficit, sensory intact to light  touch bilaterally.  Extremities: Trace to 1+ pedal edema bilaterally. Skin: Warm, dry and intact. No rash, cyanosis, or clubbing.  Psychiatric: Normal mood and affect. speech and behavior is normal. Judgment and thought content normal. Cognition and memory are normal.     Lab results: Basic Metabolic Panel:  Recent Labs  40/98/11 0734  NA 140  K 3.3*  CL 100  CO2 29  GLUCOSE 126*  BUN 11  CREATININE 0.65  CALCIUM 9.0   CBC:  Recent Labs  01/29/13 0734  WBC  8.3  HGB 14.6  HCT 41.2  MCV 82.6  PLT 187   Cardiac Enzymes: No results found for this basename: CKTOTAL, CKMB, CKMBINDEX, TROPONINI,  in the last 72 hours BNP:  Recent Labs  01/29/13 0734  PROBNP 864.2*   Imaging results:  Dg Chest 2 View  01/29/2013  *RADIOLOGY REPORT*  Clinical Data: Chest pain, shortness of breath and cough.  CHEST - 2 VIEW  Comparison: No priors.  Findings: Lung volumes are normal.  There is an ill-defined opacity in the medial aspect of the right lung base partially obscuring the medial right hemidiaphragm, concerning for an area of atelectasis and/or consolidation.  Lungs are otherwise clear.  No pleural effusions.  Mild pulmonary venous congestion without frank pulmonary edema.  Heart size is borderline enlarged, likely accentuated by patient rotation to the left which also distorts upper mediastinal contours.  IMPRESSION: 1.  Opacity in the medial aspect of the right base concerning for an area of atelectasis and/or consolidation in the right lower lobe.  Correlation for signs and symptoms of recent aspiration is recommended.   Original Report Authenticated By: Trudie Reed, M.D.     Other results: EKG: Sinus rhythm. Deep Q waves in V1 and V2. Mild ST depression and T. inversion in V5, V6. No previous EKG to compare.  Assessment & Plan by Problem: Principal Problem:   CAP (community acquired pneumonia) Active Problems:   CHF, acute   HTN (hypertension)   Chest pain  1) Chest pain, shortness of breath: Patient with new onset 4 day history of chest pain, back pain and shortness of breath. Has severely elevated blood pressure in ED. No previous medical history. Chest x-ray with possible right lower lobe infiltrate versus atelectasis. Afebrile, no productive cough. Diffuse bilateral wheezing. No previous history of asthma.  Nonsmoker.  Etiology includes community acquired pneumonia, acute CHF, ACS, acute asthma.  - Admit to inpatient telemetry. -  Duonebs every 4 hours. - Lasix challenge- 40 mg IV once considering CHF picture with elevated BNP. -  Cycle cardiac enzymes x3. First troponin negative in ED.  - 2-D echo,  TSH, hemoglobin A1c, lipid panel. - Continue Levaquin started in ED. Will discuss about continue prednisone.  2) Severe Hypertension: Blood pressure in 180's over 110's on presentation in ED. - Will start HCTZ 12.5 mg daily. - When necessary hydralazine.  3) Left knee pain: Unclear etiology. Negative DVT per preliminary Doppler report. - Left knee complete view x-ray ordered. - Percocet every 4 hours as needed for pain.  4) DVT prophylaxis: Lovenox  Dispo: Disposition is deferred at this time, awaiting improvement of current medical problems. Anticipated discharge in approximately 2-3 day(s).   The patient does not have a current PCP (No primary provider on file.), therefore will be requiring OPC follow-up after discharge.   The patient does not know have transportation limitations that hinder transportation to clinic appointments.  Signed: Elania Crowl 01/29/2013, 11:20 AM

## 2013-01-29 NOTE — Progress Notes (Signed)
VASCULAR LAB PRELIMINARY  PRELIMINARY  PRELIMINARY  PRELIMINARY  Bilateral lower extremity venous duplex  completed.    Preliminary report:  Bilateral:  No evidence of DVT or superficial thrombosis.   Small collection of fluid behind the right knee.  Xane Amsden, RVT 01/29/2013, 8:46 AM

## 2013-01-29 NOTE — ED Provider Notes (Signed)
History     CSN: [id number]  Arrival date & time 01/29/13  3086   First MD Initiated Contact with Patient 01/29/13 [phone]      Chief Complaint  Patient presents with  . Shortness of Breath    (Consider location/radiation/quality/duration/timing/severity/associated sxs/prior treatment) HPI Comments: Patient presents with complaint of shortness of breath and chest pain for the past 4 days. Patient admits to no past medical problems, however she states that she has not seen a doctor in "a very long time". Patient states that she began having shortness of breath with associated chest pain and left knee pain on Sunday (4 days ago) around midnight. Since that time symptoms have been intermittent. Symptoms are worse this morning the patient came to the emergency department. The chest pain is located in her lower mid chest. She has taken Advil without relief. Patient states that she felt better when oxygen was applied. Per nurse, patient states that symptoms are worse when she lies flat or walks. Chest pain does not radiate. Patient states that she has left knee pain all around her knee and in the muscles around her knee. No known injuries. Onset of symptoms gradual. Course is intermittent.   The history is provided by the patient. The history is limited by a language barrier. A language interpreter was used.    No past medical history on file.  No past surgical history on file.  No family history on file.  History  Substance Use Topics  . Smoking status: Not on file  . Smokeless tobacco: Not on file  . Alcohol Use: Not on file    OB History   No data available      Review of Systems  Constitutional: Negative for fever and diaphoresis.  HENT: Negative for neck pain.   Eyes: Negative for redness.  Respiratory: Positive for shortness of breath. Negative for cough.   Cardiovascular: Positive for chest pain. Negative for palpitations and leg swelling.  Gastrointestinal: Negative for  nausea, vomiting and abdominal pain.  Genitourinary: Negative for dysuria.  Musculoskeletal: Negative for back pain.  Skin: Negative for rash.  Neurological: Negative for syncope and light-headedness.    Allergies  Review of patient's allergies indicates not on file.  Home Medications   Current Outpatient Rx  Name  Route  Sig  Dispense  Refill  . ibuprofen (ADVIL,MOTRIN) 200 MG tablet   Oral   Take 200 mg by mouth every 6 (six) hours as needed for pain.           BP 176/111  Pulse 93  Temp(Src) 98.6 F (37 C) (Oral)  Resp 22  SpO2 95%  Physical Exam  Nursing note and vitals reviewed. Constitutional: She appears well-developed and well-nourished.  HENT:  Head: Normocephalic and atraumatic.  Mouth/Throat: Mucous membranes are normal. Mucous membranes are not dry.  Eyes: Conjunctivae are normal. Right eye exhibits no discharge. Left eye exhibits no discharge.  Neck: Trachea normal and normal range of motion. Neck supple. Normal carotid pulses and no JVD present. No muscular tenderness present. Carotid bruit is not present. No tracheal deviation present.  Cardiovascular: Normal rate, regular rhythm, S1 normal, S2 normal, normal heart sounds and intact distal pulses.  Exam reveals no decreased pulses.   No murmur heard. Pulmonary/Chest: Effort normal. No respiratory distress. She has wheezes (Mild, diffuse, expiratory). She exhibits no tenderness.  Mild tachypnea  Abdominal: Soft. Normal aorta and bowel sounds are normal. There is no tenderness. There is no rebound and no guarding.  Musculoskeletal: Normal range of motion.  Neurological: She is alert.  Skin: Skin is warm and dry. She is not diaphoretic. No cyanosis. No pallor.  Psychiatric: She has a normal mood and affect.    ED Course  Procedures (including critical care time)  Labs Reviewed  BASIC METABOLIC PANEL - Abnormal; Notable for the following:    Potassium 3.3 (*)    Glucose, Bld 126 (*)    All other  components within normal limits  PRO B NATRIURETIC PEPTIDE - Abnormal; Notable for the following:    Pro B Natriuretic peptide (BNP) 864.2 (*)    All other components within normal limits  CBC  URINALYSIS, ROUTINE W REFLEX MICROSCOPIC  POCT I-STAT TROPONIN I   Dg Chest 2 View  01/29/2013  *RADIOLOGY REPORT*  Clinical Data: Chest pain, shortness of breath and cough.  CHEST - 2 VIEW  Comparison: No priors.  Findings: Lung volumes are normal.  There is an ill-defined opacity in the medial aspect of the right lung base partially obscuring the medial right hemidiaphragm, concerning for an area of atelectasis and/or consolidation.  Lungs are otherwise clear.  No pleural effusions.  Mild pulmonary venous congestion without frank pulmonary edema.  Heart size is borderline enlarged, likely accentuated by patient rotation to the left which also distorts upper mediastinal contours.  IMPRESSION: 1.  Opacity in the medial aspect of the right base concerning for an area of atelectasis and/or consolidation in the right lower lobe.  Correlation for signs and symptoms of recent aspiration is recommended.   Original Report Authenticated By: Trudie Reed, M.D.      1. Wheezing     7:03 AM EKG reviewed    Vital signs reviewed and are as follows: Filed Vitals:   01/29/13 0648  BP: 176/111  Pulse: 93  Temp: 98.6 F (37 C)  Resp: 22    Date: 01/29/2013  Rate: 98  Rhythm: normal sinus rhythm  QRS Axis: normal  Intervals: normal  ST/T Wave abnormalities: ST depressions laterally  Conduction Disutrbances:none  Narrative Interpretation:   Old EKG Reviewed: none available  7:19 AM Patient seen and examined. Interpreter phone utilized. Patient is very poor historian and does not always directly answer questions that are asked. Work-up pending.   8:02 AM Much more prominent wheezing on re-exam. Will give steroids and additional treatment. Clinically appears more like asthma/COPD flare at this point.  Awaiting [place] to r/o DVT. If neg, would not pursue CT angiography to r/o PE.   10:20 AM Doppler neg. Continues to wheeze. Patient has noted "a little" improvement. Wheezing moderate now, improved somewhat.   Patient d/w and seen by Dr. Hyacinth Meeker.   Spoke with IMTS who will see and admit.   MDM  Wheezing, asthma/COPD? BNP elevated but pt does not appear clinically to be in CHF. DVT ruled out, doubt PE. Doubt ACS. Will need admission as wheezing/tachpnea persist. Levaquin given to cover for CAP.         Renne Crigler, [place] 01/29/13 1036

## 2013-01-30 ENCOUNTER — Other Ambulatory Visit: Payer: Self-pay

## 2013-01-30 ENCOUNTER — Encounter (HOSPITAL_COMMUNITY): Payer: Self-pay | Admitting: Nurse Practitioner

## 2013-01-30 DIAGNOSIS — R079 Chest pain, unspecified: Secondary | ICD-10-CM

## 2013-01-30 DIAGNOSIS — I509 Heart failure, unspecified: Secondary | ICD-10-CM

## 2013-01-30 LAB — COMPREHENSIVE METABOLIC PANEL
AST: 38 U/L — ABNORMAL HIGH (ref 0–37)
Albumin: 3.3 g/dL — ABNORMAL LOW (ref 3.5–5.2)
Alkaline Phosphatase: 93 U/L (ref 39–117)
BUN: 20 mg/dL (ref 6–23)
CO2: 29 mEq/L (ref 19–32)
Chloride: 99 mEq/L (ref 96–112)
Potassium: 3.8 mEq/L (ref 3.5–5.1)
Total Bilirubin: 0.5 mg/dL (ref 0.3–1.2)

## 2013-01-30 LAB — LEGIONELLA ANTIGEN, URINE: Legionella Antigen, Urine: NEGATIVE

## 2013-01-30 LAB — HEMOGLOBIN A1C: Mean Plasma Glucose: 128 mg/dL — ABNORMAL HIGH (ref <117)

## 2013-01-30 LAB — CBC
HCT: 38.2 % (ref 36.0–46.0)
Hemoglobin: 13.3 g/dL (ref 12.0–15.0)
MCH: 28.9 pg (ref 26.0–34.0)
MCHC: 34.8 g/dL (ref 30.0–36.0)
MCV: 83 fL (ref 78.0–100.0)
RDW: 12.9 % (ref 11.5–15.5)

## 2013-01-30 MED ORDER — LOSARTAN POTASSIUM 25 MG PO TABS
25.0000 mg | ORAL_TABLET | Freq: Every day | ORAL | Status: DC
  Administered 2013-01-30 – 2013-02-02 (×4): 25 mg via ORAL
  Filled 2013-01-30 (×4): qty 1

## 2013-01-30 MED ORDER — ASPIRIN EC 81 MG PO TBEC
81.0000 mg | DELAYED_RELEASE_TABLET | Freq: Every day | ORAL | Status: DC
  Administered 2013-01-30 – 2013-02-02 (×4): 81 mg via ORAL
  Filled 2013-01-30 (×4): qty 1

## 2013-01-30 MED ORDER — FUROSEMIDE 10 MG/ML IJ SOLN
40.0000 mg | Freq: Two times a day (BID) | INTRAMUSCULAR | Status: DC
  Administered 2013-01-30: 40 mg via INTRAVENOUS
  Filled 2013-01-30: qty 4

## 2013-01-30 MED ORDER — ALBUTEROL SULFATE HFA 108 (90 BASE) MCG/ACT IN AERS
2.0000 | INHALATION_SPRAY | Freq: Four times a day (QID) | RESPIRATORY_TRACT | Status: DC | PRN
  Administered 2013-01-30 – 2013-02-01 (×3): 2 via RESPIRATORY_TRACT
  Filled 2013-01-30: qty 6.7

## 2013-01-30 MED ORDER — HYDROCHLOROTHIAZIDE 12.5 MG PO CAPS
12.5000 mg | ORAL_CAPSULE | Freq: Every day | ORAL | Status: DC
  Administered 2013-01-30 – 2013-02-01 (×3): 12.5 mg via ORAL
  Filled 2013-01-30 (×4): qty 1

## 2013-01-30 NOTE — Progress Notes (Signed)
Subjective: Patient states that she is doing much better. Her breathing has improved. When she walked around the room, she had some pain in her legs. She states she was urinating a lot yesterday after getting the Lasix.  Denies chest pain, palpitations, pleurisy.  Started on a heparin drip yesterday until cardiac enzymes came back negative and it was discontinued last night.  She just returned from getting her 2-D echo  Objective: Vital signs in last 24 hours: Filed Vitals:   01/29/13 2025 01/30/13 0130 01/30/13 0620 01/30/13 1010  BP: 147/78 125/62 150/78 132/67  Pulse: 80 64 69 67  Temp: 98.3 F (36.8 C) 98.5 F (36.9 C) 97.9 F (36.6 C) 98.6 F (37 C)  TempSrc: Oral Oral Oral Oral  Resp: 20 18 18 20   Height:      Weight:   217 lb 9.5 oz (98.7 kg)   SpO2: 98% 97% 99% 99%   Weight change:   Intake/Output Summary (Last 24 hours) at 01/30/13 1319 Last data filed at 01/30/13 1101  Gross per 24 hour  Intake  923.8 ml  Output    700 ml  Net  223.8 ml    Physical Exam Blood pressure 132/67, pulse 67, temperature 98.6 F (37 C), temperature source Oral, resp. rate 20, height 5\' 2"  (1.575 m), weight 217 lb 9.5 oz (98.7 kg), SpO2 99.00%. General:  No acute distress, alert and oriented x 3, well-developed obese Hispanic female HEENT:  PERRL, EOMI, moist mucous membranes Cardiovascular:  Regular rate and rhythm, no murmurs, rubs or gallops Respiratory:  Crackles throughout, no wheezing today Abdomen:  Soft, nondistended, nontender, bowel sounds present Extremities:  Warm and well-perfused, trace edema, legs are tender to touch Skin: Warm, dry, no rashes Neuro: Not anxious appearing, no depressed mood, normal affect  Lab Results: Basic Metabolic Panel:  Recent Labs Lab 01/29/13 0734 01/30/13 0636  NA 140 139  K 3.3* 3.8  CL 100 99  CO2 29 29  GLUCOSE 126* 102*  BUN 11 20  CREATININE 0.65 0.93  CALCIUM 9.0 8.9   Liver Function Tests:  Recent Labs Lab  01/30/13 0636  AST 38*  ALT 48*  ALKPHOS 93  BILITOT 0.5  PROT 7.0  ALBUMIN 3.3*   CBC:  Recent Labs Lab 01/29/13 0734 01/30/13 0636  WBC 8.3 8.3  HGB 14.6 13.3  HCT 41.2 38.2  MCV 82.6 83.0  PLT 187 207   Cardiac Enzymes:  Recent Labs Lab 01/29/13 1531 01/29/13 2033  TROPONINI <0.30 <0.30   BNP:  Recent Labs Lab 01/29/13 0734  PROBNP 864.2*   Hemoglobin A1C:  Recent Labs Lab 01/29/13 1531  HGBA1C 6.1*   Fasting Lipid Panel:  Recent Labs Lab 01/29/13 1509  CHOL 232*  HDL 55  LDLCALC 160*  TRIG 83  CHOLHDL 4.2   Thyroid Function Tests:  Recent Labs Lab 01/29/13 1531  TSH 1.348   Urinalysis:  Recent Labs Lab 01/29/13 1153  COLORURINE YELLOW  LABSPEC 1.021  PHURINE 5.5  GLUCOSEU NEGATIVE  HGBUR NEGATIVE  BILIRUBINUR NEGATIVE  KETONESUR NEGATIVE  PROTEINUR 100*  UROBILINOGEN 0.2  NITRITE NEGATIVE  LEUKOCYTESUR NEGATIVE     Micro Results: Recent Results (from the past 240 hour(s))  CULTURE, BLOOD (ROUTINE X 2)     Status: None   Collection Time    01/29/13  3:10 PM      Result Value Range Status   Specimen Description BLOOD RIGHT ARM   Final   Special Requests BOTTLES DRAWN AEROBIC AND  ANAEROBIC 10CC   Final   Culture  Setup Time 01/29/2013 22:24   Final   Culture     Final   Value:        BLOOD CULTURE RECEIVED NO GROWTH TO DATE CULTURE WILL BE HELD FOR 5 DAYS BEFORE ISSUING A FINAL NEGATIVE REPORT   Report Status PENDING   Incomplete  CULTURE, BLOOD (ROUTINE X 2)     Status: None   Collection Time    01/29/13  3:20 PM      Result Value Range Status   Specimen Description BLOOD RIGHT HAND   Final   Special Requests BOTTLES DRAWN AEROBIC AND ANAEROBIC 10CC   Final   Culture  Setup Time 01/29/2013 22:28   Final   Culture     Final   Value:        BLOOD CULTURE RECEIVED NO GROWTH TO DATE CULTURE WILL BE HELD FOR 5 DAYS BEFORE ISSUING A FINAL NEGATIVE REPORT   Report Status PENDING   Incomplete   Studies/Results: Dg Chest  2 View  01/29/2013  *RADIOLOGY REPORT*  Clinical Data: Chest pain, shortness of breath and cough.  CHEST - 2 VIEW  Comparison: No priors.  Findings: Lung volumes are normal.  There is an ill-defined opacity in the medial aspect of the right lung base partially obscuring the medial right hemidiaphragm, concerning for an area of atelectasis and/or consolidation.  Lungs are otherwise clear.  No pleural effusions.  Mild pulmonary venous congestion without frank pulmonary edema.  Heart size is borderline enlarged, likely accentuated by patient rotation to the left which also distorts upper mediastinal contours.  IMPRESSION: 1.  Opacity in the medial aspect of the right base concerning for an area of atelectasis and/or consolidation in the right lower lobe.  Correlation for signs and symptoms of recent aspiration is recommended.   Original Report Authenticated By: Trudie Reed, M.D.    Dg Knee Complete 4 Views Left  01/29/2013  *RADIOLOGY REPORT*  Clinical Data: Sudden onset of left knee pain.  Non weight bearing. Negative for DVT.  LEFT KNEE - COMPLETE 4+ VIEW  Comparison: None.  Findings: The left knee appears intact. No evidence of acute fracture or subluxation.  No focal bone lesions.  Bone matrix and cortex appear intact.  No abnormal radiopaque densities in the soft tissues.  No significant effusion.  IMPRESSION: No acute bony abnormalities.   Original Report Authenticated By: Burman Nieves, M.D.    Medications:  Medications reviewed  Scheduled Meds: . aspirin EC  81 mg Oral Daily  . atorvastatin  80 mg Oral q1800  . enoxaparin (LOVENOX) injection  40 mg Subcutaneous Q24H  . furosemide  40 mg Intravenous BID  . sodium chloride  3 mL Intravenous Q12H   Continuous Infusions:  PRN Meds:.acetaminophen, acetaminophen, hydrALAZINE, ondansetron (ZOFRAN) IV, ondansetron, oxyCODONE-acetaminophen  Assessment/Plan:  1) Chest pain, shortness of breath: Patient with new onset 4 day history of chest  pain, back pain and shortness of breath. Had severely elevated blood pressure in ED. No previous medical history.  Chest x-ray with possible right lower lobe infiltrate versus atelectasis. Afebrile, no productive cough.  Diffuse bilateral wheezing. No previous history of asthma. *Nonsmoker.  Her symptoms are likely from new onset, undiagnosed congestive heart failure given clinical picture and elevated pro BNP. We are still awaiting 2-D echo results. Serial cardiac enzymes negative, heparin drip discontinued  -Continue to diurese with 40 mg IV Lasix twice a day -Strict intake/output -Followup 2-D echo results, will  consult cardiology if she does have reduced systolic function -Discontinued Levaquin yesterday as patient does not appear to have infectious component of symptoms   2)  Hypertension: Blood pressure in 180's over 110's on presentation in ED.  - Continue HCTZ 12.5 mg daily.  - When necessary hydralazine.   3) Left knee pain: Unclear etiology. Negative DVT per preliminary Doppler report. Negative knee x-rays. Pain may be from the lower extremity edema. - Percocet every 4 hours as needed for pain.   4) DVT prophylaxis: Lovenox   Dispo: Disposition is deferred at this time, awaiting improvement of current medical problems. Anticipated discharge in approximately 1-2 days PT/OT The patient does not have a current PCP (No primary provider on file.), therefore will be requiring OPC follow-up after discharge.  The patient does not know have transportation limitations that hinder transportation to clinic appointments.    LOS: 1 day   [place] Ar 01/30/2013, 1:19 PM

## 2013-01-30 NOTE — Consult Note (Signed)
CARDIOLOGY CONSULT NOTE  Patient ID: Felicia Jenkins MRN: [id number], DOB/AGE: Aug 23, 1961   Admit date: 01/29/2013 Date of Consult: 01/30/2013   Primary Physician: No primary provider on file. Primary Cardiologist: New to [place]  Pt. Profile  52 y/o female w/o prior cardiac hx who presented to the ED on 2/27 with a 5 day h/o cough, pleuritic c/p, dyspnea, and wheezing.  Problem List  Past Medical History  Diagnosis Date  . Chest pain   . Hypertension     untreated  . Obesity     Past Surgical History  Procedure Laterality Date  . Appendectomy    . Abdominal hysterectomy    . Ovarian cyst removal      Allergies  Allergies  Allergen Reactions  . Penicillins Shortness Of Breath   HPI - obtained with assistance of translator over the phone [facility])  52 y/o female with the above problem list.  She was in her USOH until this past Sunday at just around midnight, when she awoke with sharp chest pain and dyspnea.  Pain was worse with deep breathing and coughing.  She was eventually able to get back to sleep but when she awoke later in morning, it persisted.  Dyspnea, pleuritic chest pain, wheezing, and semi-productive cough persisted throughout the day on Sunday, Monday, Tuesday, and Wednesday in a constant fashion.  She felt like she probably had a cold.  She went to work on Thursday and says that while walking at work, she became weak, lost consciousness, and fell to the floor - striking her left knee.  She believes that she remained on the floor for 30-60 mins, before she regained consciousness and called her husband.    She says that her husband took her to the ED where she was noted to be hypertensive with st/t changes on ecg, most consistent with LVH.  Pro BNP was mildly elevated @ 864.  CE were negative.  Left knee films showed no acute abnormality.  She was admitted by internal medicine and has since r/o for MI.  She continues to have mild dyspnea and  pleuritic chest pain - especially when she coughs.  Dyspnea somewhat better with inhalers.  She has been receiving IV lasix, as well as HCTZ.  Weight is unchanged.  Blood pressures have been better.  Echo shows nl LV fxn with severe LVH.   Inpatient Medications  . aspirin EC  81 mg Oral Daily  . atorvastatin  80 mg Oral q1800  . enoxaparin (LOVENOX) injection  40 mg Subcutaneous Q24H  . furosemide  40 mg Intravenous BID  . sodium chloride  3 mL Intravenous Q12H   Family History - obtained with assistance of translator over the phone [employer]) Family History  Problem Relation Age of Onset  . Arthritis Mother   . 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 Marital Status: Married    Spouse Name: N/A    Number of Children: N/A  . Years of Education: N/A   Occupational History  . Not on file.   Social History Main Topics  . Smoking status: Never Smoker   . Smokeless tobacco: Never Used  . Alcohol Use: No  . Drug Use: No  . Sexually Active: Not on file   Other Topics Concern  . Not on file   Social History Narrative   Lives in [place] with husband.  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  She does not routinely exercise or adhere to any particular diet.    Review of Systems - obtained with assistance of translator over the phone [facility])  General:  No chills, fever, night sweats or weight changes.  Cardiovascular:  +++ pleuritic chest pain associated with dyspnea/wheezing.  No edema, orthopnea, palpitations, paroxysmal nocturnal dyspnea. Dermatological: No rash, lesions/masses Respiratory: +++ cough - sometimes productive with white phlegm.  +++ dyspnea as outlined above. Urologic: No hematuria, dysuria Abdominal:   No nausea, vomiting, diarrhea, bright red blood per rectum, melena, or hematemesis Neurologic:  No visual changes, wkns, changes in mental  status. All other systems reviewed and are otherwise negative except as noted above.  Physical Exam  Blood pressure 127/61, pulse 68, temperature 98 F (36.7 C), temperature source Oral, resp. rate 20, height 5\' 2"  (1.575 m), weight 217 lb 9.5 oz (98.7 kg), SpO2 97.00%.  General: Pleasant, NAD Psych: Normal affect. Neuro: Alert and oriented X 3. Moves all extremities spontaneously. HEENT: Normal  Neck: Supple without bruits.  Obese and difficult to assess JVP. Lungs:  Resp regular and unlabored, CTA. Heart: RRR no s3, s4, or murmurs. Abdomen: Soft, non-tender, non-distended, BS + x 4.  Extremities: No clubbing, cyanosis or edema. DP/PT/Radials 2+ and equal bilaterally.  Left knee is tender to touch w/o deformity.  Labs   Recent Labs  01/29/13 1531 01/29/13 2033  TROPONINI <0.30 <0.30   Lab Results  Component Value Date   WBC 8.3 01/30/2013   HGB 13.3 01/30/2013   HCT 38.2 01/30/2013   MCV 83.0 01/30/2013   PLT 207 01/30/2013    Recent Labs Lab 01/30/13 0636  NA 139  K 3.8  CL 99  CO2 29  BUN 20  CREATININE 0.93  CALCIUM 8.9  PROT 7.0  BILITOT 0.5  ALKPHOS 93  ALT 48*  AST 38*  GLUCOSE 102*   Lab Results  Component Value Date   CHOL 232* 01/29/2013   HDL 55 01/29/2013   LDLCALC 160* 01/29/2013   TRIG 83 01/29/2013   Radiology/Studies  Dg Chest 2 View  01/29/2013  *RADIOLOGY REPORT*  Clinical Data: Chest pain, shortness of breath and cough.  CHEST - 2 VIEW  Comparison: No priors.  Findings: Lung volumes are normal.  There is an ill-defined opacity in the medial aspect of the right lung base partially obscuring the medial right hemidiaphragm, concerning for an area of atelectasis and/or consolidation.  Lungs are otherwise clear.  No pleural effusions.  Mild pulmonary venous congestion without frank pulmonary edema.  Heart size is borderline enlarged, likely accentuated by patient rotation to the left which also distorts upper mediastinal contours.  IMPRESSION: 1.   Opacity in the medial aspect of the right base concerning for an area of atelectasis and/or consolidation in the right lower lobe.  Correlation for signs and symptoms of recent aspiration is recommended.   Original Report Authenticated By: Trudie Reed, M.D.    Dg Knee Complete 4 Views Left  01/29/2013  *RADIOLOGY REPORT*  Clinical Data: Sudden onset of left knee pain.  Non weight bearing. Negative for DVT.  LEFT KNEE - COMPLETE 4+ VIEW  Comparison: None.  Findings: The left knee appears intact. No evidence of acute fracture or subluxation.  No focal bone lesions.  Bone matrix and cortex appear intact.  No abnormal radiopaque densities in the soft tissues.  No significant effusion.  IMPRESSION: No acute bony abnormalities.   Original Report Authenticated By: Burman Nieves,  M.D.    ECG  Rsr,   2D Echocardiogram 01/30/2013  Study Conclusions  Left ventricle: The cavity size was normal. Dayana Dalporto thickness was increased in a pattern of moderate to severe LVH. Systolic function was normal. The estimated ejection fraction was in the range of 60% to 65%. _____________  ASSESSMENT AND PLAN  1.  Pleuritic chest pain:  In setting of cough and probable respiratory illness.  Despite 5 days of Ss, CE are negative.  ECG is abnl however suspect repolarization abnormalities are secondary to LVH, which was noted on echo.  Further LV fxn is nl w/o Konner Warrior motion abnormalities.  Will not pursue additional ischemic evaluation at this time. She will require good BP control as an outpt.  2.  Dyspnea:  ? Bronchitis vs pna.  CXR concerning for RLL consolidation though afebrile and wbc nl.  Suspect very mild pBNP elevation was secondary to htn and lvh, rather than significant CHF.  She is euvolemic on exam.  Continue inhalers.  D/c IV lasix.  3.  HTN with severe LVH:  D/c IV lasix.  Cont hctz.  Will add ARB therapy given LVH.  Avoid acei 2/2 ongoing cough.  Renal fxn/potassium stable.  4.  HL:  LDL 160.  Though LDL  is < 190 w/o h/o vascular dzs or dm, her 10 yr risk score is roughly 12%.  Therefor, cont statin.  Signed, Nicolasa Ducking, NP 01/30/2013, 3:30 PM   I have taken a history, reviewed medications, allergies, PMH, SH, FH, and reviewed ROS and examined the patient.  I agree with the assessment and plan.Agressive treatment of hypertension is critical. Secondary prevention with statin for cardiovascular disease.  Danyle Boening C. Daleen Squibb, MD, [facility] Pager:  [phone]

## 2013-01-30 NOTE — Progress Notes (Signed)
Internal Medicine Teaching Service Attending Note Date: 01/30/2013  Patient name: Felicia Jenkins  Medical record number: [id number]  Date of birth: [birth year]    This patient has been seen and discussed with the house staff. Please see their note for complete details. I concur with their findings with the following additions/corrections: Patient feels much better as compared to admission. IV heparin was discontinued last night with cardiac enzymes being negative. Patient has diuresed well but the charting of her ins and outs has been not accurate. We're still awaiting 2-D echocardiogram. Most likely cause for her shortness of breath and chest pain seems to be new heart failure. We will follow the 2-D echocardiogram results and continue to diurese her at this time.  Lars Mage 01/30/2013, 1:17 PM

## 2013-01-30 NOTE — Progress Notes (Signed)
Echocardiogram 2D Echocardiogram has been performed.  Felicia Jenkins 01/30/2013, 9:05 AM

## 2013-01-30 NOTE — Evaluation (Signed)
Physical Therapy Evaluation Patient Details Name: Felicia Jenkins MRN: [id number] DOB: Dec 01, 1961 Today's Date: 01/30/2013 Time: 4098-1191 PT Time Calculation (min): 28 min  PT Assessment / Plan / Recommendation Clinical Impression  Patient is a 52 yo female admitted with chest pain, SOB, HTN, and Lt. knee pain from fall.  Spanish speaking, so used interpreter.  Will benefit from acute PT to maximize independence prior to discharge.  Do not anticipate any f/u PT needs.    PT Assessment  Patient needs continued PT services    Follow Up Recommendations  No PT follow up    Does the patient have the potential to tolerate intense rehabilitation      Barriers to Discharge        Equipment Recommendations  Rolling walker with 5" wheels    Recommendations for Other Services     Frequency Min 3X/week    Precautions / Restrictions Precautions Precautions: None Restrictions Weight Bearing Restrictions: No   Pertinent Vitals/Pain Pain in Lt. Knee limiting mobility.      Mobility  Bed Mobility Bed Mobility: Supine to Sit;Sit to Supine Supine to Sit: 4: Min guard;HOB elevated;With rails Sit to Supine: 4: Min guard;With rail;HOB elevated Details for Bed Mobility Assistance: Visual cues for technique. Transfers Transfers: Sit to Stand;Stand to Sit Sit to Stand: 4: Min guard;With upper extremity assist;From bed Stand to Sit: 4: Min guard;With upper extremity assist;To bed Details for Transfer Assistance: Visual and tactile cues for hand placement.  Assist for safety. Ambulation/Gait Ambulation/Gait Assistance: 4: Min guard Ambulation Distance (Feet): 94 Feet Assistive device: Rolling walker Ambulation/Gait Assistance Details: Visual cues on safe use of RW. Gait Pattern: Step-through pattern;Decreased stance time - left;Decreased step length - right;Decreased step length - left;Antalgic;Decreased weight shift to left Gait velocity: Slow gait speed    Exercises     PT  Diagnosis: Difficulty walking;Abnormality of gait;Acute pain  PT Problem List: Decreased strength;Decreased activity tolerance;Decreased mobility;Decreased knowledge of use of DME;Cardiopulmonary status limiting activity;Pain PT Treatment Interventions: DME instruction;Gait training;Functional mobility training;Patient/family education   PT Goals Acute Rehab PT Goals PT Goal Formulation: With patient Time For Goal Achievement: 02/06/13 Potential to Achieve Goals: Good Pt will go Supine/Side to Sit: Independently;with HOB 0 degrees PT Goal: Supine/Side to Sit - Progress: Goal set today Pt will go Sit to Stand: with modified independence;with upper extremity assist PT Goal: Sit to Stand - Progress: Goal set today Pt will Ambulate: 51 - 150 feet;with supervision;with rolling walker PT Goal: Ambulate - Progress: Goal set today  Visit Information  Last PT Received On: 01/30/13 Assistance Needed: +1    Subjective Data  Subjective: Wants to be able to walk well before going home Patient Stated Goal: To decrease pain.  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) Communication Communication: Prefers language other than English;Interpreter utilized    Copywriter, advertising Overall Cognitive Status: Appears within functional limits for tasks assessed/performed Arousal/Alertness: Awake/alert Orientation Level: Appears intact for tasks assessed Behavior During Session: [facility] for tasks performed    Extremity/Trunk Assessment Right Upper Extremity Assessment RUE ROM/Strength/Tone: [facility] for tasks assessed Left Upper Extremity  Assessment LUE ROM/Strength/Tone: [facility] for tasks assessed Right Lower Extremity Assessment RLE ROM/Strength/Tone: [facility] for tasks  assessed Left Lower Extremity Assessment LLE ROM/Strength/Tone: Deficits;Unable to fully assess;Due to pain LLE ROM/Strength/Tone Deficits: Able to move LLE against gravity.  Difficult to MMT due to pain in knee.   Balance    End of Session PT - End of Session Equipment Utilized During Treatment: Gait belt Activity Tolerance: Patient limited by fatigue;Patient limited by pain Patient left: in bed;with call bell/phone within reach;with family/visitor present Nurse Communication: Mobility status  GP     Vena Austria 01/30/2013, 6:29 PM Durenda Hurt. Renaldo Fiddler, [facility] Pager [phone]

## 2013-01-31 ENCOUNTER — Inpatient Hospital Stay (HOSPITAL_COMMUNITY): Payer: MEDICAID

## 2013-01-31 DIAGNOSIS — R062 Wheezing: Secondary | ICD-10-CM

## 2013-01-31 LAB — BASIC METABOLIC PANEL
BUN: 29 mg/dL — ABNORMAL HIGH (ref 6–23)
CO2: 29 mEq/L (ref 19–32)
Chloride: 102 mEq/L (ref 96–112)
Creatinine, Ser: 0.82 mg/dL (ref 0.50–1.10)
Glucose, Bld: 99 mg/dL (ref 70–99)
Potassium: 3.5 mEq/L (ref 3.5–5.1)

## 2013-01-31 MED ORDER — LEVOFLOXACIN 750 MG PO TABS
750.0000 mg | ORAL_TABLET | Freq: Every day | ORAL | Status: DC
  Administered 2013-02-01 – 2013-02-02 (×2): 750 mg via ORAL
  Filled 2013-01-31 (×2): qty 1

## 2013-01-31 MED ORDER — LEVOFLOXACIN 250 MG PO TABS
250.0000 mg | ORAL_TABLET | Freq: Every day | ORAL | Status: DC
  Administered 2013-01-31: 250 mg via ORAL
  Filled 2013-01-31 (×2): qty 1

## 2013-01-31 MED ORDER — IPRATROPIUM BROMIDE 0.02 % IN SOLN: 0.5000 mg | RESPIRATORY_TRACT | Status: DC

## 2013-01-31 NOTE — Progress Notes (Signed)
Patient ID: Felicia Jenkins, female   DOB: 01-07-1961, 52 y.o.   MRN: [id number] Subjective:  Patient feeling better, denies chest pain  Objective:  Vital Signs in the last 24 hours: Temp:  [98 F (36.7 C)-98.6 F (37 C)] 98 F (36.7 C) (03/01 0500) Pulse Rate:  [67-73] 73 (03/01 0500) Resp:  [20] 20 (03/01 0500) BP: (127-150)/(61-82) 132/82 mmHg (03/01 0500) SpO2:  [95 %-99 %] 95 % (03/01 0500) Weight:  [215 lb 9.6 oz (97.796 kg)] 215 lb 9.6 oz (97.796 kg) (03/01 0500)  Intake/Output from previous day: 02/28 0701 - 03/01 0700 In: 723 [P.O.:720; I.V.:3] Out: 2000 [Urine:2000] Intake/Output from this shift:    Physical Exam: obese appearing middle aged woman, NAD HEENT: Unremarkable Neck:  No JVD, no thyromegally Lungs:  Clear with no wheezes, rales, or rhonchi HEART:  Regular rate rhythm, no murmurs, no rubs, no clicks Abd:  Flat, positive bowel sounds, no organomegally, no rebound, no guarding Ext:  2 plus pulses, no edema, no cyanosis, no clubbing Skin:  No rashes no nodules Neuro:  CN II through XII intact, motor grossly intact  Lab Results:  Recent Labs  01/29/13 0734 01/30/13 0636  WBC 8.3 8.3  HGB 14.6 13.3  PLT 187 207    Recent Labs  01/30/13 0636 01/31/13 0630  NA 139 141  K 3.8 3.5  CL 99 102  CO2 29 29  GLUCOSE 102* 99  BUN 20 29*  CREATININE 0.93 0.82    Recent Labs  01/30/13 2025 01/31/13 0155  TROPONINI <0.30 <0.30   Hepatic Function Panel  Recent Labs  01/30/13 0636  PROT 7.0  ALBUMIN 3.3*  AST 38*  ALT 48*  ALKPHOS 93  BILITOT 0.5    Recent Labs  01/29/13 1509  CHOL 232*   No results found for this basename: PROTIME,  in the last 72 hours  Imaging: Dg Knee Complete 4 Views Left  01/29/2013  *RADIOLOGY REPORT*  Clinical Data: Sudden onset of left knee pain.  Non weight bearing. Negative for DVT.  LEFT KNEE - COMPLETE 4+ VIEW  Comparison: None.  Findings: The left knee appears intact. No evidence of acute  fracture or subluxation.  No focal bone lesions.  Bone matrix and cortex appear intact.  No abnormal radiopaque densities in the soft tissues.  No significant effusion.  IMPRESSION: No acute bony abnormalities.   Original Report Authenticated By: Burman Nieves, M.D.     Cardiac Studies: Tele - nsr Assessment/Plan:  1. Chest pain with an abnormal ecg 2. Sob 3. Obesity Rec: her enzymes are negative. Outpatient myoview would be reasonable for risk stratification.   LOS: 2 days    Gregg Taylor,M.D. 01/31/2013, 9:14 AM

## 2013-01-31 NOTE — Progress Notes (Signed)
Subjective: She feels better than yesterday, but still has productive cough with yellow-colored sputum and mild sob. No fever or chills. No chest pain.  Patient reports that she did not have history of asthma, smoking, COPD, or other experience of chemical exposure in the past.  Denies chest pain, palpitations, pleurisy.  Objective: Vital signs in last 24 hours: Filed Vitals:   01/30/13 1416 01/30/13 2142 01/31/13 0139 01/31/13 0500  BP: 127/61 150/69  132/82  Pulse: 68 72  73  Temp: 98 F (36.7 C) 98 F (36.7 C)  98 F (36.7 C)  TempSrc: Oral Oral  Oral  Resp: 20 20  20   Height:      Weight:    215 lb 9.6 oz (97.796 kg)  SpO2: 97% 97% 98% 95%   Weight change: -1 lb 15.9 oz (-0.904 kg)  Intake/Output Summary (Last 24 hours) at 01/31/13 1316 Last data filed at 01/31/13 0502  Gross per 24 hour  Intake    483 ml  Output   1300 ml  Net   -817 ml    Physical Exam Blood pressure 132/82, pulse 73, temperature 98 F (36.7 C), temperature source Oral, resp. rate 20, height 5\' 2"  (1.575 m), weight 215 lb 9.6 oz (97.796 kg), SpO2 95.00%. General:  No acute distress, alert and oriented x 3, well-developed obese Hispanic female HEENT:  PERRL, EOMI, moist mucous membranes Cardiovascular:  Regular rate and rhythm, no murmurs, rubs or gallops Respiratory: wheezing bilaterally, no crackles or rubs. Extremities:  Warm and well-perfused, trace edema. Left knee tender to touch, but no swelling or redness.  Skin: Warm, dry, no rashes Neuro: Not anxious appearing, no depressed mood, normal affect  Lab Results: Basic Metabolic Panel:  Recent Labs Lab 01/30/13 0636 01/31/13 0630  NA 139 141  K 3.8 3.5  CL 99 102  CO2 29 29  GLUCOSE 102* 99  BUN 20 29*  CREATININE 0.93 0.82  CALCIUM 8.9 9.5   Liver Function Tests:  Recent Labs Lab 01/30/13 0636  AST 38*  ALT 48*  ALKPHOS 93  BILITOT 0.5  PROT 7.0  ALBUMIN 3.3*   CBC:  Recent Labs Lab 01/29/13 0734 01/30/13 0636   WBC 8.3 8.3  HGB 14.6 13.3  HCT 41.2 38.2  MCV 82.6 83.0  PLT 187 207   Cardiac Enzymes:  Recent Labs Lab 01/30/13 1451 01/30/13 2025 01/31/13 0155  TROPONINI <0.30 <0.30 <0.30   BNP:  Recent Labs Lab 01/29/13 0734  PROBNP 864.2*   Hemoglobin A1C:  Recent Labs Lab 01/29/13 1531  HGBA1C 6.1*   Fasting Lipid Panel:  Recent Labs Lab 01/29/13 1509  CHOL 232*  HDL 55  LDLCALC 160*  TRIG 83  CHOLHDL 4.2   Thyroid Function Tests:  Recent Labs Lab 01/29/13 1531  TSH 1.348   Urinalysis:  Recent Labs Lab 01/29/13 1153  COLORURINE YELLOW  LABSPEC 1.021  PHURINE 5.5  GLUCOSEU NEGATIVE  HGBUR NEGATIVE  BILIRUBINUR NEGATIVE  KETONESUR NEGATIVE  PROTEINUR 100*  UROBILINOGEN 0.2  NITRITE NEGATIVE  LEUKOCYTESUR NEGATIVE     Micro Results: Recent Results (from the past 240 hour(s))  CULTURE, BLOOD (ROUTINE X 2)     Status: None   Collection Time    01/29/13  3:10 PM      Result Value Range Status   Specimen Description BLOOD RIGHT ARM   Final   Special Requests BOTTLES DRAWN AEROBIC AND ANAEROBIC 10CC   Final   Culture  Setup Time 01/29/2013 22:24  Final   Culture     Final   Value:        BLOOD CULTURE RECEIVED NO GROWTH TO DATE CULTURE WILL BE HELD FOR 5 DAYS BEFORE ISSUING A FINAL NEGATIVE REPORT   Report Status PENDING   Incomplete  CULTURE, BLOOD (ROUTINE X 2)     Status: None   Collection Time    01/29/13  3:20 PM      Result Value Range Status   Specimen Description BLOOD RIGHT HAND   Final   Special Requests BOTTLES DRAWN AEROBIC AND ANAEROBIC 10CC   Final   Culture  Setup Time 01/29/2013 22:28   Final   Culture     Final   Value:        BLOOD CULTURE RECEIVED NO GROWTH TO DATE CULTURE WILL BE HELD FOR 5 DAYS BEFORE ISSUING A FINAL NEGATIVE REPORT   Report Status PENDING   Incomplete   Studies/Results: Dg Chest 2 View  01/31/2013  *RADIOLOGY REPORT*  Clinical Data: Pneumonia  CHEST - 2 VIEW  Comparison: 01/29/2013  Findings:  Bronchitic changes.  No focal consolidation; prior medial right lower lobe opacity is not well visualized on the current study.  No pleural effusion or pneumothorax.  The heart is top normal in size.  Mild degenerative changes of the visualized thoracolumbar spine.  IMPRESSION: Bronchitic changes.  Otherwise, no evidence of acute cardiopulmonary disease.   Original Report Authenticated By: Charline Bills, M.D.    Dg Knee Complete 4 Views Left  01/29/2013  *RADIOLOGY REPORT*  Clinical Data: Sudden onset of left knee pain.  Non weight bearing. Negative for DVT.  LEFT KNEE - COMPLETE 4+ VIEW  Comparison: None.  Findings: The left knee appears intact. No evidence of acute fracture or subluxation.  No focal bone lesions.  Bone matrix and cortex appear intact.  No abnormal radiopaque densities in the soft tissues.  No significant effusion.  IMPRESSION: No acute bony abnormalities.   Original Report Authenticated By: Burman Nieves, M.D.    Medications:  Medications reviewed  Scheduled Meds: . aspirin EC  81 mg Oral Daily  . atorvastatin  80 mg Oral q1800  . enoxaparin (LOVENOX) injection  40 mg Subcutaneous Q24H  . hydrochlorothiazide  12.5 mg Oral Daily  . levofloxacin  250 mg Oral Daily  . losartan  25 mg Oral Daily  . sodium chloride  3 mL Intravenous Q12H   Continuous Infusions:  PRN Meds:.acetaminophen, acetaminophen, albuterol, hydrALAZINE, ondansetron (ZOFRAN) IV, ondansetron, oxyCODONE-acetaminophen  Assessment/Plan:  1) CAP vs. bronchitis:  Patient was admitted for chest pain and shortness of breath. Patient with new onset 4 day history of chest pain, back pain and shortness of breath. Had severely elevated blood pressure in ED. Initially we suspected that the patient could have acute congestive heart failure. But her 2-D echo has normal EF without wall motion abnormalities. Patient had T wave inversion in lateral and precordial leads. Troponin was negative X 5. Cardiology was consulted  and thought T wave change was secondary to hypertension.  Patient reports that she did not have history of asthma, smoking, COPD, or other experience of chemical exposure in the past. It is very unlikely that patient has exacerbation of asthma or COPD. Her initial CXR showed opacity in the medial aspect of the right base concerning for an area of atelectasis and/or consolidation in the right lower lobe. Repeated CXR on 01/31/13 showed bronchitic changes. It is likely that patient has mild CAP vs.bronchitis. Patient still has wheezing  and sob.   - Appreciated cardiology's consultation in managing our patient.  - Will restart Levaquin 750 mg daily for 7 days.   2)  Hypertension: per cardiology, started HCTZ and losartan on 01/30/13. Today bp 132/82.   - Continue HCTZ 12.5 mg daily and losartan 25 mg daily. Not give ACEI due to cough - When necessary hydralazine.   3) Left knee pain: Unclear etiology. Negative DVT per preliminary Doppler report. Negative knee x-rays. Pain may be from the lower extremity edema. - Percocet every 4 hours as needed for pain.    4). HLD: LDL 160. Though LDL is < 190 w/o h/o vascular dzs or dm, her 10 yr risk score is roughly 12%. Therefor, cont statin per cardiology.   4) DVT prophylaxis: Lovenox   Dispo: Disposition is deferred at this time, awaiting improvement of current medical problems. Anticipated discharge in approximately 1-2 days PT/OT The patient does not have a current PCP (No primary provider on file.), therefore will be requiring OPC follow-up after discharge.  The patient does not know have transportation limitations that hinder transportation to clinic appointments.    LOS: 2 days   Lorretta Harp 01/31/2013, 1:16 PM

## 2013-02-01 LAB — BASIC METABOLIC PANEL
BUN: 27 mg/dL — ABNORMAL HIGH (ref 6–23)
Chloride: 99 mEq/L (ref 96–112)
Creatinine, Ser: 0.84 mg/dL (ref 0.50–1.10)
GFR calc Af Amer: >90 mL/min (ref >90)
GFR calc non Af Amer: 78 mL/min — ABNORMAL LOW (ref >90)
Glucose, Bld: 104 mg/dL — ABNORMAL HIGH (ref 70–99)
Potassium: 3.6 mEq/L (ref 3.5–5.1)

## 2013-02-01 LAB — CBC
HCT: 41.4 % (ref 36.0–46.0)
Hemoglobin: 14.4 g/dL (ref 12.0–15.0)
MCHC: 34.8 g/dL (ref 30.0–36.0)
MCV: 83.8 fL (ref 78.0–100.0)
RDW: 13.1 % (ref 11.5–15.5)

## 2013-02-01 MED ORDER — ALBUTEROL SULFATE HFA 108 (90 BASE) MCG/ACT IN AERS: 2.0000 | INHALATION_SPRAY | RESPIRATORY_TRACT | Status: DC | PRN

## 2013-02-01 MED ORDER — PREDNISONE 20 MG PO TABS
40.0000 mg | ORAL_TABLET | Freq: Once | ORAL | Status: AC
  Administered 2013-02-01: 40 mg via ORAL
  Filled 2013-02-01: qty 2

## 2013-02-01 MED ORDER — IPRATROPIUM BROMIDE HFA 17 MCG/ACT IN AERS
2.0000 | INHALATION_SPRAY | RESPIRATORY_TRACT | Status: DC
  Filled 2013-02-01: qty 12.9

## 2013-02-01 NOTE — Progress Notes (Signed)
Subjective:  She feels good, but still has wheezing and mild sob. No fever or chills. No chest pain. Denies chest pain, palpitations, pleurisy.  Objective: Vital signs in last 24 hours: Filed Vitals:   01/31/13 0500 01/31/13 1436 01/31/13 2010 02/01/13 0500  BP: 132/82 121/73 145/64 132/68  Pulse: 73 66 66 67  Temp: 98 F (36.7 C) 97.8 F (36.6 C) 97.9 F (36.6 C) 98.2 F (36.8 C)  TempSrc: Oral Oral Oral Oral  Resp: 20 20 18 20   Height:      Weight: 215 lb 9.6 oz (97.796 kg)   216 lb 1.6 oz (98.022 kg)  SpO2: 95% 95% 97% 96%   Weight change: 8 oz (0.227 kg)  Intake/Output Summary (Last 24 hours) at 02/01/13 1144 Last data filed at 02/01/13 0836  Gross per 24 hour  Intake    850 ml  Output    450 ml  Net    400 ml    Physical Exam Blood pressure 132/68, pulse 67, temperature 98.2 F (36.8 C), temperature source Oral, resp. rate 20, height 5\' 2"  (1.575 m), weight 216 lb 1.6 oz (98.022 kg), SpO2 96.00%. General:  No acute distress, alert and oriented x 3, well-developed obese Hispanic female HEENT:  PERRL, EOMI, moist mucous membranes Cardiovascular:  Regular rate and rhythm, no murmurs, rubs or gallops Respiratory: wheezing bilaterally, no crackles or rubs. Extremities:  Warm and well-perfused, trace edema. Left knee tender to touch, but no swelling or redness.  Skin: Warm, dry, no rashes Neuro: Not anxious appearing, no depressed mood, normal affect  Lab Results: Basic Metabolic Panel:  Recent Labs Lab 01/31/13 0630 02/01/13 0525  NA 141 138  K 3.5 3.6  CL 102 99  CO2 29 30  GLUCOSE 99 104*  BUN 29* 27*  CREATININE 0.82 0.84  CALCIUM 9.5 9.5   Liver Function Tests:  Recent Labs Lab 01/30/13 0636  AST 38*  ALT 48*  ALKPHOS 93  BILITOT 0.5  PROT 7.0  ALBUMIN 3.3*   CBC:  Recent Labs Lab 01/30/13 0636 02/01/13 0525  WBC 8.3 8.7  HGB 13.3 14.4  HCT 38.2 41.4  MCV 83.0 83.8  PLT 207 278   Cardiac Enzymes:  Recent Labs Lab 01/30/13 1451  01/30/13 2025 01/31/13 0155  TROPONINI <0.30 <0.30 <0.30   BNP:  Recent Labs Lab 01/29/13 0734  PROBNP 864.2*   Hemoglobin A1C:  Recent Labs Lab 01/29/13 1531  HGBA1C 6.1*   Fasting Lipid Panel:  Recent Labs Lab 01/29/13 1509  CHOL 232*  HDL 55  LDLCALC 160*  TRIG 83  CHOLHDL 4.2   Thyroid Function Tests:  Recent Labs Lab 01/29/13 1531  TSH 1.348   Urinalysis:  Recent Labs Lab 01/29/13 1153  COLORURINE YELLOW  LABSPEC 1.021  PHURINE 5.5  GLUCOSEU NEGATIVE  HGBUR NEGATIVE  BILIRUBINUR NEGATIVE  KETONESUR NEGATIVE  PROTEINUR 100*  UROBILINOGEN 0.2  NITRITE NEGATIVE  LEUKOCYTESUR NEGATIVE     Micro Results: Recent Results (from the past 240 hour(s))  CULTURE, BLOOD (ROUTINE X 2)     Status: None   Collection Time    01/29/13  3:10 PM      Result Value Range Status   Specimen Description BLOOD RIGHT ARM   Final   Special Requests BOTTLES DRAWN AEROBIC AND ANAEROBIC 10CC   Final   Culture  Setup Time 01/29/2013 22:24   Final   Culture     Final   Value:        BLOOD  CULTURE RECEIVED NO GROWTH TO DATE CULTURE WILL BE HELD FOR 5 DAYS BEFORE ISSUING A FINAL NEGATIVE REPORT   Report Status PENDING   Incomplete  CULTURE, BLOOD (ROUTINE X 2)     Status: None   Collection Time    01/29/13  3:20 PM      Result Value Range Status   Specimen Description BLOOD RIGHT HAND   Final   Special Requests BOTTLES DRAWN AEROBIC AND ANAEROBIC 10CC   Final   Culture  Setup Time 01/29/2013 22:28   Final   Culture     Final   Value:        BLOOD CULTURE RECEIVED NO GROWTH TO DATE CULTURE WILL BE HELD FOR 5 DAYS BEFORE ISSUING A FINAL NEGATIVE REPORT   Report Status PENDING   Incomplete   Studies/Results: Dg Chest 2 View  01/31/2013  *RADIOLOGY REPORT*  Clinical Data: Pneumonia  CHEST - 2 VIEW  Comparison: 01/29/2013  Findings: Bronchitic changes.  No focal consolidation; prior medial right lower lobe opacity is not well visualized on the current study.  No pleural  effusion or pneumothorax.  The heart is top normal in size.  Mild degenerative changes of the visualized thoracolumbar spine.  IMPRESSION: Bronchitic changes.  Otherwise, no evidence of acute cardiopulmonary disease.   Original Report Authenticated By: Charline Bills, M.D.    Medications:  Medications reviewed  Scheduled Meds: . aspirin EC  81 mg Oral Daily  . atorvastatin  80 mg Oral q1800  . enoxaparin (LOVENOX) injection  40 mg Subcutaneous Q24H  . hydrochlorothiazide  12.5 mg Oral Daily  . levofloxacin  750 mg Oral Daily  . losartan  25 mg Oral Daily  . predniSONE  40 mg Oral Once  . sodium chloride  3 mL Intravenous Q12H   Continuous Infusions:  PRN Meds:.acetaminophen, acetaminophen, albuterol, hydrALAZINE, ondansetron (ZOFRAN) IV, ondansetron, oxyCODONE-acetaminophen  Assessment/Plan:  1) Bronchitisvs. CAP:  Patient was admitted for chest pain and shortness of breath. Patient had new onset 4 day history of chest pain, back pain and shortness of breath. She dad severely elevated blood pressure in ED. Initially we suspected that the patient could have acute congestive heart failure. But her 2-D echo has normal EF without wall motion abnormalities. Patient had T wave inversion in lateral and precordial leads. Troponin was negative X 5. Cardiology was consulted and thought T wave change was secondary to hypertension.  Patient reports that she did not have history of asthma, smoking, COPD, or other experience of chemical exposure in the past. It is very unlikely that patient has exacerbation of asthma or COPD. Her initial CXR showed opacity in the medial aspect of the right base concerning for an area of atelectasis and/or consolidation in the right lower lobe. Repeated CXR on 01/31/13 showed bronchitic changes. It is likely that patient has mild CAP vs. bronchitis. pateinet feel good, but still has wheezing and mild sob.   -Continue Levaquin 750 mg daily for 7 days. -continue albuterol  inhaler and Atrovent inhaler - Will start prednison 40 mg and taper down.   2)  Hypertension: per cardiology, started HCTZ and losartan on 01/30/13. Today bp 132/68  - Continue HCTZ 12.5 mg daily and losartan 25 mg daily. Not give ACEI due to cough - When necessary hydralazine.   3) Left knee pain: Unclear etiology. Negative DVT per preliminary Doppler report. Negative knee x-rays. Pain may be from the lower extremity edema. - Percocet every 4 hours as needed for pain.  4). HLD: LDL 160. Though LDL is < 190 w/o h/o vascular dzs or dm, her 10 yr risk score is roughly 12%. Therefor, cont statin per cardiology.   4) DVT prophylaxis: Lovenox   Dispo: Disposition is deferred at this time. If her wheezing improve after starting prednison tomorrow, will discharge home tomorrow.   The patient does not have a current PCP (No primary Reesa Gotschall on file.), therefore will be requiring OPC follow-up after discharge.  The patient does not know have transportation limitations that hinder transportation to clinic appointments.    LOS: 3 days   Lorretta Harp 02/01/2013, 11:44 AM

## 2013-02-01 NOTE — Progress Notes (Signed)
Patient ambulated with two people assist from one end of the hall way to the other end and back to her room (with two studend nurses) and on room air. Patient tolerated ambulation without difficulty. One of the student nurses was able to translate spanish to english and therefore able to find out that patient is in need of a shower chair for she is afraid her knees will give out while showering. Case Manager consult ordered to help address with equipment needed.Also patient states when she request for water, would like water with no ice for fyi purposes. Patient complained of pain in left knee this morning and was given PRN percocet and later this evening complained of very small pain (paquito) and therefore administered Tylenol per PRN order. Notified oncoming nurse in report and will continue to monitor as needed.

## 2013-02-02 DIAGNOSIS — J209 Acute bronchitis, unspecified: Principal | ICD-10-CM

## 2013-02-02 DIAGNOSIS — E785 Hyperlipidemia, unspecified: Secondary | ICD-10-CM

## 2013-02-02 DIAGNOSIS — J40 Bronchitis, not specified as acute or chronic: Secondary | ICD-10-CM

## 2013-02-02 MED ORDER — ACETAMINOPHEN 325 MG PO TABS: 650.0000 mg | ORAL_TABLET | Freq: Four times a day (QID) | ORAL | Status: DC | PRN

## 2013-02-02 MED ORDER — OXYCODONE-ACETAMINOPHEN 5-325 MG PO TABS: 1.0000 | ORAL_TABLET | Freq: Three times a day (TID) | ORAL | Status: DC | PRN

## 2013-02-02 MED ORDER — ALBUTEROL SULFATE HFA 108 (90 BASE) MCG/ACT IN AERS: 2.0000 | INHALATION_SPRAY | RESPIRATORY_TRACT | Status: DC | PRN

## 2013-02-02 MED ORDER — HYDROCHLOROTHIAZIDE 12.5 MG PO CAPS
25.0000 mg | ORAL_CAPSULE | Freq: Every day | ORAL | Status: DC
  Administered 2013-02-02: 25 mg via ORAL
  Filled 2013-02-02 (×2): qty 2

## 2013-02-02 MED ORDER — LOSARTAN POTASSIUM 25 MG PO TABS: 25.0000 mg | ORAL_TABLET | Freq: Every day | ORAL | Status: DC

## 2013-02-02 MED ORDER — IPRATROPIUM BROMIDE HFA 17 MCG/ACT IN AERS: 2.0000 | INHALATION_SPRAY | Freq: Four times a day (QID) | RESPIRATORY_TRACT | Status: DC | PRN

## 2013-02-02 MED ORDER — PREDNISONE (PAK) 10 MG PO TABS: ORAL_TABLET | Freq: Every day | ORAL | Status: DC

## 2013-02-02 MED ORDER — HYDROCHLOROTHIAZIDE 12.5 MG PO CAPS: 25.0000 mg | ORAL_CAPSULE | Freq: Every day | ORAL | Status: DC

## 2013-02-02 MED ORDER — ASPIRIN 81 MG PO TBEC: 81.0000 mg | DELAYED_RELEASE_TABLET | Freq: Every day | ORAL | Status: DC

## 2013-02-02 MED ORDER — LEVOFLOXACIN 750 MG PO TABS: 750.0000 mg | ORAL_TABLET | Freq: Every day | ORAL | Status: DC

## 2013-02-02 MED ORDER — ATORVASTATIN CALCIUM 80 MG PO TABS: 80.0000 mg | ORAL_TABLET | Freq: Every day | ORAL | Status: DC

## 2013-02-02 NOTE — Evaluation (Signed)
Occupational Therapy Evaluation and Discharge Patient Details Name: Edmonia Lynch MRN: [id number] DOB: 12/15/1960 Today's Date: 02/02/2013 Time: 4098-1191 OT Time Calculation (min): 17 min  OT Assessment / Plan / Recommendation Clinical Impression  This 52 yo female with chest pain, SOB, HTN, and Lt. knee pain from fall presents to acute OT with all education completed. No further OT needs, will D/C from acute OT.    OT Assessment  Patient does not need any further OT services    Follow Up Recommendations  No OT follow up       Equipment Recommendations  Tub/shower seat          Precautions / Restrictions Precautions Precautions: None Restrictions Weight Bearing Restrictions: No   Pertinent Vitals/Pain Mild left knee pain    ADL  Transfers/Ambulation Related to ADLs: Mod I for all ADL Comments: Pt I with UB ADLS, but needs min A for LBADLS due to left sore knee        Visit Information  Last OT Received On: 02/02/13 Assistance Needed: +1    Subjective Data  Subjective: I am doing better, my husband can help me if I need it (via interpreter over phone) Patient Stated Goal: Go home today   Prior Functioning     Home Living Lives With: Spouse Available Help at Discharge: Family;Available 24 hours/day Type of Home: Apartment Home Access: Stairs to enter Entrance Stairs-Number of Steps: 1 Entrance Stairs-Rails: None Home Layout: One level Bathroom Shower/Tub: Tub/shower unit;Curtain Firefighter: Standard Home Adaptive Equipment: Grab bars in shower Prior Function Level of Independence: Independent Able to Take Stairs?: Yes Vocation: Full time employment Communication Communication: Prefers language other than English;Interpreter utilized (Spanish) Dominant Hand: Right         Vision/Perception Vision - History Baseline Vision: No visual deficits   Cognition  Cognition Overall Cognitive Status: Appears within functional limits for  tasks assessed/performed Arousal/Alertness: Awake/alert Orientation Level: Appears intact for tasks assessed Behavior During Session: [facility] for tasks performed    Extremity/Trunk Assessment Right Upper Extremity Assessment RUE ROM/Strength/Tone: Within functional levels Left Upper Extremity Assessment LUE ROM/Strength/Tone: Within functional levels     Mobility Bed Mobility Details for Bed Mobility Assistance: Pt up in chair upon arrival Transfers Transfers: Sit to Stand;Stand to Sit Sit to Stand: 6: Modified independent (Device/Increase time) Stand to Sit: 6: Modified independent (Device/Increase time)           End of Session OT - End of Session Equipment Utilized During Treatment:  (None needed) Activity Tolerance: Patient tolerated treatment well Patient left: in chair Nurse Communication:  (Pt asking when she will leave today)       Evette Georges [phone] 02/02/2013, 3:58 PM

## 2013-02-02 NOTE — Discharge Summary (Signed)
Patient Name:  Felicia Jenkins  MRN: [id number]  PCP: No primary Bethan Adamek on file.  DOB:  May 28, 1961       Date of Admission:  01/29/2013  Date of Discharge:  02/02/2013      Attending Physician: Dr. Lars Mage, MD         DISCHARGE DIAGNOSES:  1. Acute bronchitis vs. Mild CAP 2. HTN (hypertension) 3. HLD (hyperlipidemia) 4. Chest pain 5.  Knee pain  DISPOSITION AND FOLLOW-UP: Felicia Jenkins is to follow-up with the listed providers as detailed below, at patient's visiting, please address following issues:  1. Check BMP to assure creatinine is OK. Patient was discharged on ARB which is new to her on this admission.  2. Patient need to repeat CXR in 6 weeks to assure the complete resolution of abnormal x-ray. 3. Please assure that cardiology has arranged stress test for her as outpatient. Dr. Johney Frame is to make this arrangement after her acute respiratory issues are improved.     Follow-up Information   Please follow up. (you will be called for a hospital follow up appointment in 10 to 14 days. )          Discharge Orders   Future Orders Complete By Expires     Call MD for:  difficulty breathing, headache or visual disturbances  As directed     Call MD for:  redness, tenderness, or signs of infection (pain, swelling, redness, odor or green/yellow discharge around incision site)  As directed     Call MD for:  temperature >100.4  As directed     Diet - low sodium heart healthy  As directed     Increase activity slowly  As directed        DISCHARGE MEDICATIONS:   Medication List    TAKE these medications       acetaminophen 325 MG tablet  Commonly known as:  TYLENOL  Take 2 tablets (650 mg total) by mouth every 6 (six) hours as needed.     albuterol 108 (90 BASE) MCG/ACT inhaler  Commonly known as:  PROVENTIL HFA;VENTOLIN HFA  Inhale 2 puffs into the lungs every 4 (four) hours as needed for wheezing or shortness of breath (for shortness of  breath aqnd wheezing).     aspirin 81 MG EC tablet  Take 1 tablet (81 mg total) by mouth daily.     atorvastatin 80 MG tablet  Commonly known as:  LIPITOR  Take 1 tablet (80 mg total) by mouth daily at 6 PM.     hydrochlorothiazide 12.5 MG capsule  Commonly known as:  MICROZIDE  Take 2 capsules (25 mg total) by mouth daily.     ibuprofen 200 MG tablet  Commonly known as:  ADVIL,MOTRIN  Take 200 mg by mouth every 6 (six) hours as needed for pain.     levofloxacin 750 MG tablet  Commonly known as:  LEVAQUIN  Take 1 tablet (750 mg total) by mouth daily.     losartan 25 MG tablet  Commonly known as:  COZAAR  Take 1 tablet (25 mg total) by mouth daily.     oxyCODONE-acetaminophen 5-325 MG per tablet  Commonly known as:  PERCOCET/ROXICET  Take 1-2 tablets by mouth every 8 (eight) hours as needed.        CONSULTS: Cardiology    PROCEDURES PERFORMED:  Dg Chest 2 View  01/31/2013  *RADIOLOGY REPORT*  Clinical Data: Pneumonia  CHEST - 2 VIEW  Comparison: 01/29/2013  IMPRESSION:  Bronchitic changes.  Otherwise, no evidence of acute cardiopulmonary disease.    Dg Chest 2 View  01/29/2013  *RADIOLOGY REPORT*  Clinical Data: Chest pain, shortness of breath and cough.  CHEST - 2 VIEW  IMPRESSION: 1.  Opacity in the medial aspect of the right base concerning for an area of atelectasis and/or consolidation in the right lower lobe.  Correlation for signs and symptoms of recent aspiration is recommended.     Dg Knee Complete 4 Views Left  01/29/2013  *RADIOLOGY REPORT*  Clinical Data: Sudden onset of left knee pain.  Non weight bearing. Negative for DVT.  LEFT KNEE - COMPLETE 4+ VIEW IMPRESSION: No acute bony abnormalities.        ADMISSION DATA:  H&P: Patient is a 52 year old Spanish-speaking woman with no previous medical history. Most of the history has been collected from the chart and from the residents who presented the patient to me as my communication was limited by the language  barrier.  She did complain of chest pain to me which was much less than what she came in with. She described the pain as present in the middle of the chest, sharp in character, 2-3/10 at this time, no radiation, worse with walking and improved with rest. Chest pain was associated with shortness of breath which has progressively been getting worse over last 2-3 days. Patient is unable to lie flat at night as lying flat makes the shortness of breath worse.  Patient also complained of left knee pain which started on Sunday.   Filed Vitals:     01/29/13 1000  01/29/13 1100  01/29/13 1154  01/29/13 1305   BP:  169/83  160/97  144/57  157/62   Pulse:  101  103  81  79   Temp:        98.5 F (36.9 C)   TempSrc:        Oral   Resp:  22  18  14  20   Height:        5\' 2" (1.575 m)   Weight:        21 7 lb 9.5 oz (98.7 kg)   SpO2:  100%  97%  98%  98%    Physical Exam: General:  Vital signs reviewed and noted. Well-developed obese abdomen, in no acute distress; alert, appropriate and cooperative throughout examination.   Head:  Normocephalic, atraumatic.   Eyes:  PERRL, EOMI, No signs of anemia or jaundince.   Nose:  Mucous membranes moist, not inflammed, nonerythematous.   Throat:  Oropharynx nonerythematous, no exudate appreciated.    Neck:  No deformities, masses, or tenderness noted.Supple, No carotid Bruits, no JVD.   Lungs:   Normal respiratory effort. Clear to auscultation BL without crackles or wheezes.   Heart:  RRR. S1 and S2 normal without gallop, murmur, or rubs.   Abdomen:   BS normoactive. Soft, Nondistended, non-tender.  No masses or organomegaly.   Extremities:  No pretibial edema.left knee tender to touch but no swellings/redness noted    Neurologic:  A&O X3, CN II - XII are grossly intact. Motor strength is 5/5 in the all 4 extremities, Sensations intact to light touch, Cerebellar signs negative.   Skin:  No visible rashes, scars.      Lab results: Basic Metabolic  Panel:  Recent Labs   01/29/13 0734   NA  140   K  3.3*   CL  100   CO2  29   GLUCOSE  126*   BUN  11   CREATININE  0.65   CALCIUM  9.0    CBC:  Recent Labs   01/29/13 0734   WBC  8.3   HGB  14.6   HCT  41.2   MCV  82.6   PLT  187    Cardiac Enzymes:  Recent Labs   01/29/13 1531   TROPONINI  <0.30    Fasting Lipid Panel:  Recent Labs   01/29/13 1509   CHOL  232*   HDL  55   LDLCALC  160*   TRIG  83   CHOLHDL  4.2      HOSPITAL COURSE:  1) Bronchitis vs. CAP:  Patient was admitted for chest pain and shortness of breath. Patient had new onset 4 day history of chest pain, back pain and shortness of breath. She dad severely elevated blood pressure in ED. Initially we suspected that the patient could have acute congestive heart failure. But her 2-D echo has normal EF without wall motion abnormalities. Patient had T wave inversion in lateral and precordial leads. Troponin was negative X 5. Cardiology was consulted and thought T wave change was secondary to hypertension.  Patient reports that she did not have history of asthma, smoking, COPD, or other experience of chemical exposure in the past. It is very unlikely that patient has exacerbation of asthma or COPD. Her initial CXR showed opacity in the medial aspect of the right base concerning for an area of atelectasis and/or consolidation in the right lower lobe. Repeated CXR on 01/31/13 showed bronchitic changes. It is likely that patient has mild CAP vs. bronchitis. Then we started to treat her as bronchitis vs. mild CAP with oral Levaquin, albuterol inhaler, Atrovent inhaler, and prednisone. She responded to the treatment well. Her wheezing become minimal at discharge. Her SOB resolved. Her condition significantly improved. She is discharged on Levaquin for total of 7 days, tapering prednisone, and inhalers. Our clinic office will call her for a follow up appointment in 10 to 14 days ( our clinic was closed in afternoon due  to possible snowing). Cardiologist, Dr. Johney Frame will arrange outpatient stress test once her acute respiratory issues are improved .  2)  Hypertension: Per cardiology, started HCTZ and losartan on 01/30/13 and continued at discharge. Did not give ACEI due to cough. She need to have BMP when she comes for follow up to assure her creatinine is OK.   3) Left knee pain: Unclear etiology. Negative DVT per Doppler report. Negative knee x-rays. Discharged on ibuprofen and short term of percocet as needed for pain.    4). HLD: LDL 160. Though LDL is < 190 w/o h/o vascular dzs or dm, her 10 yr risk score is roughly 12%. Therefor, cont statin per cardiology.   DISCHARGE DATA: Vital Signs: BP 146/80  Pulse 71  Temp(Src) 98.4 F (36.9 C) (Oral)  Resp 18  Ht 5\' 2"  (1.575 m)  Wt 214 lb (97.07 kg)  BMI 39.13 kg/m2  SpO2 97%  Labs: No results found for this or any previous visit (from the past 24 hour(s)).   Services Ordered on Discharge: Y = Yes; Blank = No PT:   OT:   RN:   Equipment:   Other:      Time Spent on Discharge: 37 min  Dow Adolph

## 2013-02-02 NOTE — Progress Notes (Signed)
Edmonia Lynch to be D/C'd Home per MD order.  Discussed with the patient and all questions fully answered.    Medication List    TAKE these medications       acetaminophen 325 MG tablet  Commonly known as:  TYLENOL  Take 2 tablets (650 mg total) by mouth every 6 (six) hours as needed.     albuterol 108 (90 BASE) MCG/ACT inhaler  Commonly known as:  PROVENTIL HFA;VENTOLIN HFA  Inhale 2 puffs into the lungs every 4 (four) hours as needed for wheezing or shortness of breath (for shortness of breath aqnd wheezing).     aspirin 81 MG EC tablet  Take 1 tablet (81 mg total) by mouth daily.     atorvastatin 80 MG tablet  Commonly known as:  LIPITOR  Take 1 tablet (80 mg total) by mouth daily at 6 PM.     hydrochlorothiazide 12.5 MG capsule  Commonly known as:  MICROZIDE  Take 2 capsules (25 mg total) by mouth daily.     ibuprofen 200 MG tablet  Commonly known as:  ADVIL,MOTRIN  Take 200 mg by mouth every 6 (six) hours as needed for pain.     ipratropium 17 MCG/ACT inhaler  Commonly known as:  ATROVENT HFA  Inhale 2 puffs into the lungs 4 (four) times daily as needed for wheezing.     levofloxacin 750 MG tablet  Commonly known as:  LEVAQUIN  Take 1 tablet (750 mg total) by mouth daily.     losartan 25 MG tablet  Commonly known as:  COZAAR  Take 1 tablet (25 mg total) by mouth daily.     oxyCODONE-acetaminophen 5-325 MG per tablet  Commonly known as:  PERCOCET/ROXICET  Take 1-2 tablets by mouth every 8 (eight) hours as needed.     predniSONE 10 MG tablet  Commonly known as:  STERAPRED UNI-PAK  Take by mouth daily. Please take 40 mg on 3/4, 3/5, then take 20 mg on 3/6, 3/7 and 3/8, then take 10 mg on 3/9, 3/10, and 3/11, then stop.        VVS, Skin clean, dry and intact without evidence of skin break down, no evidence of skin tears noted. IV catheter discontinued intact. Site without signs and symptoms of complications. Dressing and pressure applied.  An  After Visit Summary was printed and given to the patient. Patient escorted via WC, and D/C home via private auto.  HANEY, RASHIDA 02/02/2013 6:25 PM

## 2013-02-02 NOTE — Progress Notes (Signed)
Patient ID: Felicia Jenkins, female   DOB: 09/09/1961, 52 y.o.   MRN: [id number] Subjective:  Patient feeling better, denies chest pain.  SOB is much improved.  Objective:  Vital Signs in the last 24 hours: Temp:  [97.7 F (36.5 C)-98.3 F (36.8 C)] 97.7 F (36.5 C) (03/03 0500) Pulse Rate:  [62-82] 82 (03/03 0500) Resp:  [18] 18 (03/03 0500) BP: (129-145)/(67-77) 145/77 mmHg (03/03 0500) SpO2:  [94 %-98 %] 97 % (03/03 0500) Weight:  [214 lb (97.07 kg)] 214 lb (97.07 kg) (03/03 0500)  Intake/Output from previous day: 03/02 0701 - 03/03 0700 In: 580 [P.O.:580] Out: -  Intake/Output from this shift:   Tele- sinus Physical Exam: obese appearing middle aged woman, NAD HEENT: OP clear Neck:  No JVD, no thyromegally Lungs:  Clear with no wheezes, rales, or rhonchi HEART:  Regular rate rhythm, no murmurs, no rubs, no clicks Abd:  Flat, positive bowel sounds, no organomegally, no rebound, no guarding Ext:  2 plus pulses, no edema, no cyanosis, no clubbing Skin:  No rashes no nodules Neuro:  CN II through XII intact, motor grossly intact  Lab Results:  Recent Labs  02/01/13 0525  WBC 8.7  HGB 14.4  PLT 278    Recent Labs  01/31/13 0630 02/01/13 0525  NA 141 138  K 3.5 3.6  CL 102 99  CO2 29 30  GLUCOSE 99 104*  BUN 29* 27*  CREATININE 0.82 0.84    Recent Labs  01/30/13 2025 01/31/13 0155  TROPONINI <0.30 <0.30   Hepatic Function Panel No results found for this basename: PROT, ALBUMIN, AST, ALT, ALKPHOS, BILITOT, BILIDIR, IBILI,  in the last 72 hours No results found for this basename: CHOL,  in the last 72 hours No results found for this basename: PROTIME,  in the last 72 hours  Imaging: Dg Chest 2 View  01/31/2013  *RADIOLOGY REPORT*  Clinical Data: Pneumonia  CHEST - 2 VIEW  Comparison: 01/29/2013  Findings: Bronchitic changes.  No focal consolidation; prior medial right lower lobe opacity is not well visualized on the current study.  No pleural  effusion or pneumothorax.  The heart is top normal in size.  Mild degenerative changes of the visualized thoracolumbar spine.  IMPRESSION: Bronchitic changes.  Otherwise, no evidence of acute cardiopulmonary disease.   Original Report Authenticated By: Charline Bills, M.D.    Echo reviewed  Assessment/Plan:  1. Atypical chest pain- improved Outpatient myoview 2. Sob- improving with treatment of bronchitis/ pneumonia 3. Obesity 4. HTN- improved  No further inpatient CV workup.  I will arrange outpatient stress test once her acute respiratory issues are improved (1-2 weeks).  Cardiology to see as needed while here.  James Allred,M.D. 02/02/2013, 7:34 AM

## 2013-02-02 NOTE — Care Management Note (Signed)
    Page 1 of 1   02/02/2013     2:56:22 PM   CARE MANAGEMENT NOTE 02/02/2013  Patient:  Felicia Jenkins   Account Number:  [id number]  Date Initiated:  02/02/2013  Documentation initiated by:  [facility]  Subjective/Objective Assessment:   52yo female  in with acute onset chest pain, progressive shortness of breath and EKG changes     Action/Plan:   Home with significant other/ home with shower chair and rollator   Anticipated DC Date:  02/02/2013   Anticipated DC Plan:  HOME/SELF CARE      DC Planning Services  CM consult      PAC Choice  DURABLE MEDICAL EQUIPMENT   Choice offered to / List presented to:     DME arranged  [employer]  [facility] - Lavone Nian      DME agency  [facility]        Status of service:   Medicare Important Message given?   (If response is "NO", the following Medicare IM given date fields will be blank) Date Medicare IM given:   Date Additional Medicare IM given:    Discharge Disposition:    Per UR Regulation:    If discussed at Long Length of Stay Meetings, dates discussed:    Comments:  02/02/13 @ 1430.Marland KitchenMarland KitchenOletta Cohn, RN, BSN, [employer] [phone] Set up DME rollator and shower chair through [facility].

## 2013-02-04 LAB — CULTURE, BLOOD (ROUTINE X 2)

## 2013-02-09 ENCOUNTER — Other Ambulatory Visit: Payer: Self-pay | Admitting: *Deleted

## 2013-02-09 DIAGNOSIS — R079 Chest pain, unspecified: Secondary | ICD-10-CM

## 2013-02-11 ENCOUNTER — Ambulatory Visit: Payer: Self-pay | Admitting: Internal Medicine

## 2013-02-11 ENCOUNTER — Telehealth: Payer: Self-pay | Admitting: Internal Medicine

## 2013-02-12 ENCOUNTER — Telehealth: Payer: Self-pay | Admitting: *Deleted

## 2013-02-12 NOTE — Telephone Encounter (Signed)
Patient phone number has been disconnected. Unable to scheduled myoview. also unable to reach patient contact.

## 2013-02-16 ENCOUNTER — Ambulatory Visit (INDEPENDENT_AMBULATORY_CARE_PROVIDER_SITE_OTHER): Payer: Self-pay | Admitting: Internal Medicine

## 2013-02-16 ENCOUNTER — Encounter: Payer: Self-pay | Admitting: Internal Medicine

## 2013-02-16 VITALS — BP 120/80 | HR 91 | Temp 98.1°F | Ht 62.0 in | Wt 218.2 lb

## 2013-02-16 DIAGNOSIS — E785 Hyperlipidemia, unspecified: Secondary | ICD-10-CM

## 2013-02-16 DIAGNOSIS — I10 Essential (primary) hypertension: Secondary | ICD-10-CM

## 2013-02-16 DIAGNOSIS — R079 Chest pain, unspecified: Secondary | ICD-10-CM

## 2013-02-16 LAB — BASIC METABOLIC PANEL WITH GFR
Calcium: 9.1 mg/dL (ref 8.4–10.5)
GFR, Est African American: >89 mL/min
Glucose, Bld: 93 mg/dL (ref 70–99)
Sodium: 139 mEq/L (ref 135–145)

## 2013-02-16 MED ORDER — MELOXICAM 7.5 MG PO TABS: 7.5000 mg | ORAL_TABLET | Freq: Every day | ORAL | Status: AC

## 2013-02-16 MED ORDER — SERTRALINE HCL 50 MG PO TABS: 50.0000 mg | ORAL_TABLET | Freq: Every day | ORAL | Status: DC

## 2013-02-16 NOTE — Patient Instructions (Signed)
We will see you back in 6 weeks. We have given you a medicine for depression called zoloft which you take one pill per day.   The new medicine for pain is called meloxicam and you take 1 pill every day. It may take a couple of weeks to really work. Take the other pain medicines until then.  Our number is [phone].   Nos veremos de nuevo en 6 semanas. Les hemos dado un medicamento para la depresin llamada zoloft que se toma una pastilla al da.   El nuevo medicamento para Chief Technology Officer se llama meloxicam y se toma 1 pastilla todos los [place]. Se puede tomar un par de semanas para trabajar realmente. [employer] otros medicamentos para el dolor hasta entonces.   Annie Sable es [phone].

## 2013-02-17 NOTE — Assessment & Plan Note (Signed)
She was started on high potency statin in the hospital however not sure she meets guidelines for this. Will await stress test. If negative can de-escalate this therapy to a more affordable and long term option.

## 2013-02-17 NOTE — Progress Notes (Signed)
Subjective:     Patient ID: Felicia Jenkins, female   DOB: 08/25/1961, 52 y.o.   MRN: [id number]  HPI The patient is a 52 year old female who is Spanish speaking only and therefore interpreter is used throughout the visit. She is also unable to read [place] or [place]. She comes in for a hospital followup and to establish as a new patient. She did have no past medical history prior to hospitalization and she was diagnosed with hypertension, hyperlipidemia, chest pain, acute bronchitis during the hospital stay. The acute bronchitis does seem to be resolving with less problems with breathing, less cough. She states that occasionally her breathing still runs short however she is very verbose during our visit and is able to maintain that without getting short of breath. Her chest pain is still off and on despite negative troponins during hospital stay. She did have an EKG change of the T wave inversion. Was thought to be related to hypertension. She also had an echo done during hospital stay which showed an EF of 60-65%, mild to moderate LVH, no diastolic dysfunction. She was to followup with a EKG stress test as an outpatient however the scheduling people were unable to get a hold of her. We have obtained current telephone numbers today and therefore pass the information along so that she can get her stress test. She states that she struggles a lot with depression and her mood is bad and she'll break into tears at any time. She states that her mother passed away many years ago when she still remembers it as if it was yesterday. She states that she sometimes has problems sleeping and that when she goes back to work this could be a problem. She is very concerned that there is something wrong with her heart and states that she was told by doctors that doing anything could cause for her problems. She said that she is eating good and not falling since leaving the hospital. She is still having some pain in her  knee. This pain started about 2 weeks ago when she fell and she has never had knee pain in the past.   She is unclear of most of her medical past although this provider did try to review that. We will try to review that at our next visit when she is a little calmer. Her allergies, past medical history, family medical history, surgical history was reviewed. She does not drink and she never smoked.   Review of Systems  Constitutional: Negative for fever, chills, diaphoresis, activity change, appetite change, fatigue and unexpected weight change.  Respiratory: Positive for chest tightness and shortness of breath. Negative for apnea, cough, choking, wheezing and stridor.   Cardiovascular: Positive for chest pain. Negative for palpitations and leg swelling.  Gastrointestinal: Negative for nausea, vomiting, abdominal pain, diarrhea and constipation.  Endocrine: Negative.   Genitourinary: Negative.   Musculoskeletal: Positive for myalgias. Negative for back pain, joint swelling, arthralgias and gait problem.  Skin: Negative for color change, pallor, rash and wound.  Neurological: Negative for dizziness, tremors, seizures, syncope, facial asymmetry, speech difficulty, weakness, light-headedness, numbness and headaches.  Psychiatric/Behavioral: Positive for sleep disturbance, dysphoric mood and decreased concentration. Negative for hallucinations, behavioral problems, confusion, self-injury and agitation. The patient is not nervous/anxious.        Objective:   Physical Exam  Constitutional: She is oriented to person, place, and time. She appears well-developed and well-nourished.  Obese  HENT:  Head: Normocephalic and atraumatic.  Eyes: EOM are normal.  Pupils are equal, round, and reactive to light.  Neck: Normal range of motion. Neck supple. No JVD present. No tracheal deviation present. No thyromegaly present.  Cardiovascular: Regular rhythm, normal heart sounds and intact distal pulses.    Slightly fast rate  Pulmonary/Chest: Effort normal. No respiratory distress. She has no wheezes. She has no rales. She exhibits tenderness.  Abdominal: Soft. Bowel sounds are normal. She exhibits no distension. There is no tenderness. There is no rebound and no guarding.  Musculoskeletal: Normal range of motion. She exhibits no edema and no tenderness.  Neurological: She is alert and oriented to person, place, and time. No cranial nerve deficit.  Skin: Skin is warm and dry. No rash noted. No erythema. No pallor.       Assessment/Plan:   1. Please see problem-oriented charting.  2. Disposition-the patient will be seen back in 6 weeks. She was given prescription for Zoloft, meloxicam. She was advised to use Percocet as needed especially at bedtime so that she can sleep. She was advised to followup with cardiology to do stress test. She may need adjustment of her blood pressure medication at next visit.

## 2013-02-17 NOTE — Assessment & Plan Note (Signed)
She was started on HCTZ 25 mg daily and losartan 25 mg daily hospitalization. Her repeat BP today was 120/80 and will check BMP for any change in Creatinine.

## 2013-02-17 NOTE — Assessment & Plan Note (Signed)
Patient's age and characteristics give her about a 30% pre-test probability although without the chest pain her demographics would give her about a 3% cardiac risk for 10 years. Will get stress test.

## 2013-02-23 ENCOUNTER — Ambulatory Visit: Payer: Self-pay

## 2013-03-27 ENCOUNTER — Encounter: Payer: Self-pay | Admitting: Internal Medicine

## 2013-03-27 ENCOUNTER — Ambulatory Visit (INDEPENDENT_AMBULATORY_CARE_PROVIDER_SITE_OTHER): Payer: Self-pay | Admitting: Internal Medicine

## 2013-03-27 VITALS — BP 177/97 | HR 75 | Temp 97.8°F | Ht 62.0 in | Wt 209.7 lb

## 2013-03-27 DIAGNOSIS — R079 Chest pain, unspecified: Secondary | ICD-10-CM

## 2013-03-27 DIAGNOSIS — E785 Hyperlipidemia, unspecified: Secondary | ICD-10-CM

## 2013-03-27 DIAGNOSIS — I10 Essential (primary) hypertension: Secondary | ICD-10-CM

## 2013-03-27 DIAGNOSIS — F329 Major depressive disorder, single episode, unspecified: Secondary | ICD-10-CM

## 2013-03-27 DIAGNOSIS — Z23 Encounter for immunization: Secondary | ICD-10-CM

## 2013-03-27 MED ORDER — ASPIRIN 81 MG PO TBEC: 81.0000 mg | DELAYED_RELEASE_TABLET | Freq: Every day | ORAL | Status: DC

## 2013-03-27 MED ORDER — LOVASTATIN 20 MG PO TABS: 20.0000 mg | ORAL_TABLET | Freq: Every day | ORAL | Status: DC

## 2013-03-27 MED ORDER — LISINOPRIL-HYDROCHLOROTHIAZIDE 20-25 MG PO TABS: 1.0000 | ORAL_TABLET | Freq: Every day | ORAL | Status: DC

## 2013-03-27 MED ORDER — CITALOPRAM HYDROBROMIDE 20 MG PO TABS: 20.0000 mg | ORAL_TABLET | Freq: Every day | ORAL | Status: DC

## 2013-03-27 NOTE — Patient Instructions (Signed)
Vamos a cambiar sus medicamentos para hacerlos ms accesibles.   Para tomar la presin arterial HCTZ / lisinopril 1 comprimido por da (esta pldora tiene 2 medicamentos en l).   Para el colesterol tomar lovastatina 1 comprimido por da.   Tomar una aspirina diaria.   Para el estado de nimo tomar Celexa 1 pastilla diaria.   Vuelve dentro de 2-3 meses para un chequeo de su presin arterial.   Le daremos informacin sobre el odontlogo, pero tienes que pedir la cita.   Annie Sable es [phone] si tiene problemas o preguntas.  General Instructions:  We will change your medicines to make them more affordable.   For blood pressure take HCTZ/lisinopril 1 pill per day (this pill has 2 medicines in it).  For cholesterol take lovastatin 1 pill per day.  Take an Aspirin daily.   For mood take celexa 1 pill daily.  Come back in 2-3 months for a check of your blood pressure.   We will give you information about the dentist but you have to call for the appointment.   Our number is [phone] if you have problems or questions.   Treatment Goals:  Goals (1 Years of Data) as of 03/27/13   None      Progress Toward Treatment Goals:  Treatment Goal 03/27/2013  Blood pressure deteriorated    Self Care Goals & Plans:  Self Care Goal 03/27/2013  Manage my medications take my medicines as prescribed  Eat healthy foods eat more vegetables  Be physically active find an activity I enjoy       Care Management & Community Referrals:  Referral 03/27/2013  Referrals made for care management support none needed      Dr. Genella Mech  Clinic number [phone]

## 2013-03-27 NOTE — Progress Notes (Signed)
Interpreter Wyvonnia Dusky for Dr Dorise Hiss

## 2013-03-29 DIAGNOSIS — F329 Major depressive disorder, single episode, unspecified: Secondary | ICD-10-CM | POA: Insufficient documentation

## 2013-03-29 NOTE — Assessment & Plan Note (Signed)
Will resolve this problem at next visit if not recurrent. She was experiencing chest pain with an acute bronchitis. She is no longer needing inhalers and has not had recurrence of this pain. Her 10 year risk of MI is 3% and will follow. She is on ASA daily and low potency statin for cost effectiveness. Her last LDL was 160 and her only risk factor is HTN.

## 2013-03-29 NOTE — Progress Notes (Signed)
Subjective:     Patient ID: Felicia Jenkins, female   DOB: 1961/09/23, 52 y.o.   MRN: [id number]  HPI The patient is a spanish speaking 52 YO female who comes in for a return visit. She has PMH of HTN, depression, hyperlipidemia. She states that since last visit she has run out of medicines as the pharmacy needs a new prescription and she was unable to call due to the language barrier. She states that she goes to [facility] and they sometimes have sometimes have someone that speaks spanish. She is still having intermittent pain in her knee and sometimes in her shoulder when she has worked a long time. She tried the meloxicam for her knee and this did not give her relief. She has been back to work for a couple of weeks now. She has not had recurrence of the chest discomfort she was hospitalized for previously. She states that her crying spells are less and she is sleeping better on the celexa although she only has a few left and needs new prescription. Although review of her bottles state that she has several refills left. She would like an injection for her knee today. She is denying SOB, nausea/vomiting/diarrhea. She does not have prescription drug coverage. She is speaking fairly quickly and sometimes interrupts the interpretor. As we are ending our visit she asks about sweating and feels like she sweats a lot. This has been going on her whole life and she is reminded to drink lots of fluids if she is sweating a lot. This problem is unchanged over the course of her whole life and was not affected by her hysterectomy in her 20s.  Review of Systems  Constitutional: Negative for fever, chills, diaphoresis, activity change, appetite change, fatigue and unexpected weight change.  Respiratory: Negative for apnea, cough, choking, chest tightness, shortness of breath, wheezing and stridor.   Cardiovascular: Negative for chest pain, palpitations and leg swelling.  Gastrointestinal: Negative for nausea,  vomiting, abdominal pain, diarrhea and constipation.  Endocrine: Negative.   Genitourinary: Negative.   Musculoskeletal: Positive for arthralgias. Negative for myalgias, back pain, joint swelling and gait problem.  Skin: Negative for color change, pallor, rash and wound.  Neurological: Negative for dizziness, tremors, seizures, syncope, facial asymmetry, speech difficulty, weakness, light-headedness, numbness and headaches.  Psychiatric/Behavioral: Positive for sleep disturbance, dysphoric mood and decreased concentration. Negative for hallucinations, behavioral problems, confusion, self-injury and agitation. The patient is not nervous/anxious.        Objective:   Physical Exam  Constitutional: She is oriented to person, place, and time. She appears well-developed and well-nourished.  Obese  HENT:  Head: Normocephalic and atraumatic.  Eyes: EOM are normal. Pupils are equal, round, and reactive to light.  Neck: Normal range of motion. Neck supple. No JVD present. No tracheal deviation present. No thyromegaly present.  Cardiovascular: Regular rhythm, normal heart sounds and intact distal pulses.   Slightly fast rate  Pulmonary/Chest: Effort normal. No respiratory distress. She has no wheezes. She has no rales. She exhibits no tenderness.  Abdominal: Soft. Bowel sounds are normal. She exhibits no distension. There is no tenderness. There is no rebound and no guarding.  Musculoskeletal: Normal range of motion. She exhibits tenderness. She exhibits no edema.  Some tenderness in the knee without evidence of ACL or PCL tear. No instability of the joint and no swelling of the knee. No erythema or signs of infection or skin breakdown or trauma.   Neurological: She is alert and oriented to person,  place, and time. No cranial nerve deficit.  Skin: Skin is warm and dry. No rash noted. No erythema. No pallor.       Assessment/Plan:   1. Please see problem oriented charting.  2. Disposition -  Patient will return in 2-3 months. Will simplify regimen to include only ASA daily, lovastatin 20 mg daily, lisinopril/HCTZ 20/25 mg and celexa 20 mg daily. She was given Tdap at today's visit and depo-medrol 40 mg injection for joint pain.

## 2013-03-29 NOTE — Assessment & Plan Note (Signed)
BP Readings from Last 3 Encounters:  03/27/13 177/97  02/16/13 120/80  02/02/13 146/80    Lab Results  Component Value Date   NA 139 02/16/2013   K 4.3 02/16/2013   CREATININE 0.72 02/16/2013    Assessment: Blood pressure control: moderately elevated Progress toward BP goal:  deteriorated Comments: patient was out of her medications for at least one week at our visit. I provided new prescriptions and advised her to have the pharmacy call us if there were problems. She was also given a paper with our clinic number and my name so she can give it to the pharmacist even if they are unable to speak Spanish.   Plan: Medications:  change HCTZ 25 and ARB to combination pill lisinopril/HCTZ 20/25 mg as she has never tried this in the past and it is $4. Educational resources provided: Education officer, museum tools provided: home blood pressure logbook Other plans: Patient was well controlled on her medications at last visit and will restart them today and hope she will be able to stay on them after reassurance and explanation from interpretor and myself.

## 2013-03-29 NOTE — Assessment & Plan Note (Signed)
Continue celexa and she seems to be getting some benefit from this medication.

## 2013-03-29 NOTE — Assessment & Plan Note (Signed)
Will change from high potency statin (lipitor 80 mg daily) to lovastatin 20 mg daily for cost effectiveness. Her 10 year risk of heart attack is 3%. Continue daily ASA and statin. Will continue to decrease her risk by controlling her BPs.

## 2013-03-30 NOTE — Progress Notes (Signed)
Case discussed with Dr. Kollar soon after the resident saw the patient.  We reviewed the resident's history and exam and pertinent patient test results.  I agree with the assessment, diagnosis, and plan of care documented in the resident's note. 

## 2013-10-23 ENCOUNTER — Encounter: Payer: Self-pay | Admitting: Internal Medicine

## 2016-05-04 ENCOUNTER — Ambulatory Visit (INDEPENDENT_AMBULATORY_CARE_PROVIDER_SITE_OTHER): Payer: Self-pay | Admitting: Physician Assistant

## 2016-05-04 DIAGNOSIS — L299 Pruritus, unspecified: Secondary | ICD-10-CM

## 2016-05-04 DIAGNOSIS — R030 Elevated blood-pressure reading, without diagnosis of hypertension: Secondary | ICD-10-CM

## 2016-05-04 DIAGNOSIS — IMO0001 Reserved for inherently not codable concepts without codable children: Secondary | ICD-10-CM

## 2016-05-04 DIAGNOSIS — L209 Atopic dermatitis, unspecified: Secondary | ICD-10-CM

## 2016-05-04 MED ORDER — MUPIROCIN 2 % EX OINT: 1.0000 "application " | TOPICAL_OINTMENT | Freq: Two times a day (BID) | CUTANEOUS | Status: DC

## 2016-05-04 MED ORDER — BETAMETHASONE DIPROPIONATE AUG 0.05 % EX OINT: TOPICAL_OINTMENT | Freq: Two times a day (BID) | CUTANEOUS | Status: DC

## 2016-05-04 MED ORDER — TRIAMCINOLONE ACETONIDE 0.025 % EX LOTN: 1.0000 "application " | TOPICAL_LOTION | Freq: Every day | CUTANEOUS | Status: DC

## 2016-05-04 MED ORDER — CETIRIZINE HCL 10 MG PO TABS: 10.0000 mg | ORAL_TABLET | Freq: Every day | ORAL | Status: DC

## 2016-05-04 MED ORDER — MONTELUKAST SODIUM 10 MG PO TABS: 10.0000 mg | ORAL_TABLET | Freq: Every day | ORAL | Status: AC

## 2016-05-04 MED ORDER — MONTELUKAST SODIUM 10 MG PO TABS: 10.0000 mg | ORAL_TABLET | Freq: Every day | ORAL | Status: DC

## 2016-05-04 NOTE — Patient Instructions (Addendum)
IF you received an x-ray today, you will receive an invoice from [facility]. Please contact [facility] at [phone] with questions or concerns regarding your invoice.   IF you received labwork today, you will receive an invoice from [employer]. Please contact [facility] at [phone] with questions or concerns regarding your invoice.   Our billing staff will not be able to assist you with questions regarding bills from these companies.  You will be contacted with the lab results as soon as they are available. The fastest way to get your results is to activate your My Chart account. Instructions are located on the last page of this paperwork. If you have not heard from [place] regarding the results in 2 weeks, please contact this office.     We recommend that you schedule a mammogram for breast cancer screening. Typically, you do not need a referral to do this. Please contact a local imaging center to schedule your mammogram.  [facility] - [phone]  *ask for the Radiology Department [facility]) - [phone] or [phone]  [facility] - [phone] [facility] - [phone] [facility] - [phone]  *ask for the Radiology Department [facility] - [phone]  *ask for the Radiology Department [facility] - [phone]  *ask for the Mammography Department [facility] - [phone]  Please moisturize your skin well with hypo-allergenic moisturizers--cetaphil, Eucerin, or aqua for, twice per day.    You need to follow instruction listed below.  Do not scratch. I would like you to also use the eyedrops twice per day of the Ketotifen.   I am going to assess your kidney and liver function prior to starting you on blood pressure medication.   I will contact you regarding the results and medication choice following this.    Eczema (Eczema) El eczema, tambin llamada dermatitis atpica, es una afeccin de la piel que causa inflamacin de la misma. Este trastorno produce una erupcin roja y sequedad y escamas en la piel. Hay gran picazn. El eczema generalmente empeora durante los meses fros del invierno y generalmente desaparece o mejora con el tiempo clido del verano. El eczema generalmente comienza a manifestarse en la infancia. Algunos nios desarrollan este trastorno y ste puede prolongarse en la Estate manager/land agent.  CAUSAS  La causa exacta no se conoce pero parece ser una afeccin hereditaria. Generalmente las personas que sufren eczema tienen una historia familiar de eczema, alergias, asma o fiebre de heno. Esta enfermedad no es contagiosa. Algunas causas de los brotes pueden ser:   Contacto con alguna cosa a la que es sensible o Best boy.  Librarian, academic. SIGNOS Y SNTOMAS  Piel seca y escamosa.  Erupcin roja y que pica.  Picazn. Esta puede ocurrir antes de que aparezca la erupcin y puede ser muy intensa. DIAGNSTICO  El diagnstico de eczema se realiza basndose en los sntomas y en la historia clnica. TRATAMIENTO  El eczema no puede curarse, pero los sntomas generalmente pueden controlarse con tratamiento y Development worker, community. Un plan de tratamiento puede incluir:  Control de la picazn y el rascado.  Utilice antihistamnicos de venta libre segn las indicaciones, para Associate Professor. Es especialmente til por las noches cuando la picazn tiende a Theme park manager.  Utilice medicamentos de venta libre para la picazn, segn las indicaciones del mdico.  Evite rascarse. El rascado hace que la picazn empeore. Tambin puede producir una infeccin en  la piel (imptigo) debido a las lesiones en la piel causadas por el rascado.  Mantenga la piel bien humectada con cremas, todos [place]. La piel quedar hmeda y ayudar a prevenir la sequedad. Las lociones que contengan alcohol y agua deben evitarse debido a que  pueden Best boy.  Limite la exposicin a las cosas a las que es sensible o alrgico (alrgenos).  Reconozca las situaciones que puedan causar estrs.  Desarrolle un plan para controlar el estrs. INSTRUCCIONES PARA EL CUIDADO EN EL HOGAR   Tome slo medicamentos de venta libre o recetados, segn las indicaciones del mdico.  No aplique nada sobre la piel sin Science writer a su mdico.  Deber tomar baos o duchas de corta duracin (5 minutos) en agua tibia (no caliente). Use jabones suaves para el bao. No deben tener perfume. Puede agregar aceite de bao no perfumado al agua del bao. Es Manufacturing engineer el jabn y el bao de espuma.  Inmediatamente despus del bao o de la ducha, cuando la piel aun est hmeda, aplique una crema humectante en todo el cuerpo. Este ungento debe ser en base a vaselina. La piel quedar hmeda y ayudar a prevenir la sequedad. Cuanto ms espeso sea el ungento, mejor. No deben tener perfume.  Mantenga las uas cortas. Es posible que los nios con eczema necesiten usar guantes o mitones por la noche, despus de aplicarse el ungento.  Vista al [employer] con ropa de algodn o Chief of Staff de algodn. Vstalo con ropas ligeras ya que el calor aumenta la picazn.  Un nio con eczema debe permanecer alejado de personas que tengan ampollas febriles o llagas del resfro. El virus que causa las ampollas febriles (herpes simple) puede ocasionar una infeccin grave en la piel de los nios que padecen eczema. SOLICITE ATENCIN MDICA SI:   La picazn le impide dormir.  La erupcin empeora o no mejora dentro de la semana en la que se inicia el [place].  Observa pus o costras amarillas en la zona de la erupcin.  Tiene fiebre.  Aparece un brote despus de haber estado en contacto con alguna persona que tiene ampollas febriles.   Esta informacin no tiene Theme park manager el consejo del mdico. Asegrese de hacerle al mdico cualquier pregunta que tenga.   Document  Released: 11/19/2005 Document Revised: 09/09/2013 Elsevier Interactive Patient Education 2016 Elsevier Inc.  Eczema Eczema, also called atopic dermatitis, is a skin disorder that causes inflammation of the skin. It causes a red rash and dry, scaly skin. The skin becomes very itchy. Eczema is generally worse during the cooler winter months and often improves with the warmth of summer. Eczema usually starts showing signs in infancy. Some children outgrow eczema, but it may last through adulthood.  CAUSES  The exact cause of eczema is not known, but it appears to run in families. People with eczema often have a family history of eczema, allergies, asthma, or hay fever. Eczema is not contagious. Flare-ups of the condition may be caused by:   Contact with something you are sensitive or allergic to.   Stress. SIGNS AND SYMPTOMS  Dry, scaly skin.   Red, itchy rash.   Itchiness. This may occur before the skin rash and may be very intense.  DIAGNOSIS  The diagnosis of eczema is usually made based on symptoms and medical history. TREATMENT  Eczema cannot be cured, but symptoms usually can be controlled with treatment and other strategies. A treatment plan might include:  Controlling the itching and scratching.  Use over-the-counter antihistamines as directed for itching. This is especially useful at night when the itching tends to be worse.   Use over-the-counter steroid creams as directed for itching.   Avoid scratching. Scratching makes the rash and itching worse. It may also result in a skin infection (impetigo) due to a break in the skin caused by scratching.   Keeping the skin well moisturized with creams every day. This will seal in moisture and help prevent dryness. Lotions that contain alcohol and water should be avoided because they can dry the skin.   Limiting exposure to things that you are sensitive or allergic to (allergens).   Recognizing situations that cause  stress.   Developing a plan to manage stress.  HOME CARE INSTRUCTIONS   Only take over-the-counter or prescription medicines as directed by your health care provider.   Do not use anything on the skin without checking with your health care provider.   Keep baths or showers short (5 minutes) in warm (not hot) water. Use mild cleansers for bathing. These should be unscented. You may add nonperfumed bath oil to the bath water. It is best to avoid soap and bubble bath.   Immediately after a bath or shower, when the skin is still damp, apply a moisturizing ointment to the entire body. This ointment should be a petroleum ointment. This will seal in moisture and help prevent dryness. The thicker the ointment, the better. These should be unscented.   Keep fingernails cut short. Children with eczema may need to wear soft gloves or mittens at night after applying an ointment.   Dress in clothes made of cotton or cotton blends. Dress lightly, because heat increases itching.   A child with eczema should stay away from anyone with fever blisters or cold sores. The virus that causes fever blisters (herpes simplex) can cause a serious skin infection in children with eczema. SEEK MEDICAL CARE IF:   Your itching interferes with sleep.   Your rash gets worse or is not better within 1 week after starting treatment.   You see pus or soft yellow scabs in the rash area.   You have a fever.   You have a rash flare-up after contact with someone who has fever blisters.    This information is not intended to replace advice given to you by your health care provider. Make sure you discuss any questions you have with your health care provider.   Document Released: 11/16/2000 Document Revised: 09/09/2013 Document Reviewed: 06/22/2013 Elsevier Interactive Patient Education [employer]2016 Elsevier Inc.

## 2016-05-05 LAB — COMPLETE METABOLIC PANEL WITH GFR
ALBUMIN: 4 g/dL (ref 3.6–5.1)
ALK PHOS: 72 U/L (ref 33–130)
ALT: 17 U/L (ref 6–29)
AST: 20 U/L (ref 10–35)
BILIRUBIN TOTAL: 0.5 mg/dL (ref 0.2–1.2)
BUN: 23 mg/dL (ref 7–25)
CO2: 24 mmol/L (ref 20–31)
Calcium: 9.4 mg/dL (ref 8.6–10.4)
Chloride: 102 mmol/L (ref 98–110)
Creat: 0.89 mg/dL (ref 0.50–1.05)
GFR, Est African American: 84 mL/min (ref >=60)
GFR, Est Non African American: 73 mL/min (ref >=60)
GLUCOSE: 92 mg/dL (ref 65–99)
Potassium: 4.1 mmol/L (ref 3.5–5.3)
SODIUM: 139 mmol/L (ref 135–146)
TOTAL PROTEIN: 6.8 g/dL (ref 6.1–8.1)

## 2016-05-05 LAB — TSH: TSH: 4.37 mIU/L

## 2016-05-07 ENCOUNTER — Ambulatory Visit (INDEPENDENT_AMBULATORY_CARE_PROVIDER_SITE_OTHER): Payer: Self-pay | Admitting: Physician Assistant

## 2016-05-07 VITALS — BP 168/112 | HR 84 | Temp 97.4°F | Resp 16 | Ht 59.0 in | Wt 227.0 lb

## 2016-05-07 DIAGNOSIS — I10 Essential (primary) hypertension: Secondary | ICD-10-CM

## 2016-05-07 MED ORDER — LISINOPRIL-HYDROCHLOROTHIAZIDE 20-12.5 MG PO TABS: 1.0000 | ORAL_TABLET | Freq: Every day | ORAL | Status: DC

## 2016-05-07 NOTE — Patient Instructions (Addendum)
tylenol extra strength (500mg ) - 2 pills every 8 hours for headache  For your blood pressure - take 1 pill each morning  Come back and see me Felicia SagoSarah on 6/20 from 8am-3pm    Tylenol fuerza extra (500mg ) - 2 pldoras cada 8 horas para el dolor de cabeza  Para su presin arterial - tome 1 pldora cada maana  Vuelve a verme Felicia Jenkins en 6/20 de 8 am-3pm   IF you received an x-ray today, you will receive an invoice from [facility]. Please contact [facility] at [phone] with questions or concerns regarding your invoice.   IF you received labwork today, you will receive an invoice from [facility]. Please contact [facility] at [phone] with questions or concerns regarding your invoice.   Our billing staff will not be able to assist you with questions regarding bills from these companies.  You will be contacted with the lab results as soon as they are available. The fastest way to get your results is to activate your My Chart account. Instructions are located on the last page of this paperwork. If you have not heard from us regarding the results in 2 weeks, please contact this office.

## 2016-05-07 NOTE — Progress Notes (Signed)
   Felicia Jenkins  MRN: [id number] DOB: 08/05/1961  Subjective:  Pt presents to clinic with husband because her headache is no better and she is unsure what she should be taking.  She has started the medications for allergys and she wants to make sure that she is not mixing to many medications.  The headache is the same and so is the pain behind the eyes.  She called and wanted some pain medications but she was told to come in likely because of the language barrier on the phones.  She has been using ice and that helps the pain but she wants to make sure that is ok to use.  [employer] Interpretor services used during visit Lewie Chamber- [place]  Patient Active Problem List   Diagnosis Date Noted  . Benign essential HTN 05/07/2016    Current Outpatient Prescriptions on File Prior to Visit  Medication Sig Dispense Refill  . augmented betamethasone dipropionate (DIPROLENE) 0.05 % ointment Apply topically 2 (two) times daily. To knee.  Do not apply longer than 2 weeks. 30 g 0  . cetirizine (ZYRTEC) 10 MG tablet Take 1 tablet (10 mg total) by mouth daily. 30 tablet 3  . montelukast (SINGULAIR) 10 MG tablet Take 1 tablet (10 mg total) by mouth at bedtime. 30 tablet 3  . mupirocin ointment (BACTROBAN) 2 % Place 1 application into the nose 2 (two) times daily. Apply to scalp. 22 g 0  . Triamcinolone Acetonide 0.025 % LOTN Apply 1 application topically daily. To face.  Avoid eyelids.  No longer than 2 weeks. 60 mL 0   No current facility-administered medications on file prior to visit.    Allergies  Allergen Reactions  . Penicillins Palpitations    Review of Systems  Constitutional: Negative for fever and chills.  Eyes: Negative for visual disturbance.  Neurological: Positive for headaches.   Objective:  BP 168/112 mmHg  Pulse 84  Temp(Src) 97.4 F (36.3 C) (Oral)  Resp 16  Ht 4\' 11"  (1.499 m)  Wt 227 lb (102.967 kg)  BMI 45.82 kg/m2  SpO2 97%  Physical Exam  Constitutional: She is oriented to  person, place, and time and well-developed, well-nourished, and in no distress.  HENT:  Head: Normocephalic and atraumatic.  Right Ear: Hearing and external ear normal.  Left Ear: Hearing and external ear normal.  Eyes: Conjunctivae are normal.  Neck: Normal range of motion.  Pulmonary/Chest: Effort normal.  Neurological: She is alert and oriented to person, place, and time. Gait normal.  Skin: Skin is warm and dry.  Psychiatric: Mood, memory, affect and judgment normal.  Vitals reviewed.   Assessment and Plan :  Benign essential HTN - Plan: lisinopril-hydrochlorothiazide (ZESTORETIC) 20-12.5 MG tablet   The visit was about 40 mins long due to the use of the interpretor service used.  We talked about her normal labs and that tylenol was a better medication to use due to her elevated BP. We started her on BP medications and talked about side effects and risks - she will RTC in 2 weeks for HTN check.  She will continue her other medications.  Her questions were answered and she agreed with the plan.  Benny LennertSarah Weber PA-C  [facility] 05/07/2016 5:44 PM

## 2016-05-08 NOTE — Progress Notes (Signed)
[facility] [address], [place] [address] [phone]  Date:  05/04/2016   Name:  Felicia Jenkins   DOB:  Feb 25, 1961   MRN:  [id number]  PCP:  No primary care provider on file.    History of Present Illness:  Felicia Jenkins is a 55 y.o. female patient who presents to [facility] for cc of eye pain and headache for the last 3 weeks. She reports eye redness of both eyes and watery eyes for the last few weeks. She has a head pain that surrounds her entire front and back of her head. There is no dizziness. She has considerable congestion. She also has mild coughing. There is nasal mucus at all times. Patient is taking multiple antihistamine medications and nasal drops. She also has pruritic rash that is on her legs and arms. She denies any fever. She has had sneezing. She recently started taking Allegra. This has helped some. No shortness of breath or trouble breathing.  Elevated blood pressure today. She has been on a blood pressure medication but stopped when she thought that it was well controlled at the time. She denies any chest pain, palpitations, or shortness of breath. Last antihypertensive use was 5 years ago. There is no vision changes. She denies leg swelling.   Patient Active Problem List   Diagnosis Date Noted  . Benign essential HTN 05/07/2016    No past medical history on file.  No past surgical history on file.  Social History  Substance Use Topics  . Smoking status: Never Smoker   . Smokeless tobacco: Never Used  . Alcohol Use: No    No family history on file.  Allergies  Allergen Reactions  . Penicillins Palpitations    Medication list has been reviewed and updated.  No current outpatient prescriptions on file prior to visit.   No current facility-administered medications on file prior to visit.    ROS   Physical Examination:  Ideal Body Weight:    Physical Exam  Constitutional: She is oriented to person, place, and time. She  appears well-developed and well-nourished. No distress.  HENT:  Head: Normocephalic and atraumatic.  Right Ear: Tympanic membrane, external ear and ear canal normal.  Left Ear: Tympanic membrane, external ear and ear canal normal.  Nose: Mucosal edema (Severe swelling and cyanosis of the nasal turbinates.) and rhinorrhea (clear) present. Right sinus exhibits no maxillary sinus tenderness and no frontal sinus tenderness. Left sinus exhibits no maxillary sinus tenderness and no frontal sinus tenderness.  Mouth/Throat: No uvula swelling. No oropharyngeal exudate, posterior oropharyngeal edema or posterior oropharyngeal erythema.  Eyes: EOM are normal. Pupils are equal, round, and reactive to light. Right conjunctiva is injected. Left conjunctiva is injected.  Cobblestoning of the lower inner lid  Cardiovascular: Normal rate and regular rhythm.  Exam reveals no gallop, no distant heart sounds and no friction rub.   No murmur heard. Pulses:      Radial pulses are 2+ on the right side, and 2+ on the left side.       Dorsalis pedis pulses are 2+ on the right side, and 2+ on the left side.  Pulmonary/Chest: Effort normal. No respiratory distress. She has no decreased breath sounds. She has no wheezes. She has no rhonchi.  Lymphadenopathy:       Head (right side): No submandibular, no tonsillar, no preauricular and no posterior auricular adenopathy present.       Head (left side): No submandibular, no tonsillar, no preauricular  and no posterior auricular adenopathy present.  Neurological: She is alert and oriented to person, place, and time.  Skin: She is not diaphoretic.  None erythematous papular rash with scaling this predominant along the upper and lower extremities. There is no tenderness along these areas.  Psychiatric: She has a normal mood and affect. Her behavior is normal.     Assessment and Plan: Felicia HuhBalbina Renteria is a 55 y.o. female who is here today for headache and rash. This appears  to be allergies at this time. This is undertreated. Topical cream suggested.  She will start zyrtec and antihistamine eye drops that she has.  Also will start singulair.   We will check her kidney function prior to start of anti-hypertensives.  Atopic dermatitis - Plan: augmented betamethasone dipropionate (DIPROLENE) 0.05 % ointment, cetirizine (ZYRTEC) 10 MG tablet, montelukast (SINGULAIR) 10 MG tablet, mupirocin ointment (BACTROBAN) 2 %, Triamcinolone Acetonide 0.025 % LOTN, DISCONTINUED: augmented betamethasone dipropionate (DIPROLENE) 0.05 % ointment, DISCONTINUED: cetirizine (ZYRTEC) 10 MG tablet, DISCONTINUED: mupirocin ointment (BACTROBAN) 2 %, DISCONTINUED: montelukast (SINGULAIR) 10 MG tablet, DISCONTINUED: Triamcinolone Acetonide 0.025 % LOTN  Elevated blood pressure - Plan: COMPLETE METABOLIC PANEL WITH GFR  Pruritic condition - Plan: COMPLETE METABOLIC PANEL WITH GFR, TSH  Trena PlattStephanie English, PA-C [facility] 6/29/20171:59 PM

## 2016-05-31 ENCOUNTER — Encounter: Payer: Self-pay | Admitting: Physician Assistant

## 2016-06-02 ENCOUNTER — Ambulatory Visit (INDEPENDENT_AMBULATORY_CARE_PROVIDER_SITE_OTHER): Payer: Self-pay | Admitting: Family Medicine

## 2016-06-02 VITALS — BP 136/82 | HR 82 | Temp 98.0°F | Resp 18 | Ht 59.0 in | Wt 225.0 lb

## 2016-06-02 DIAGNOSIS — Z5181 Encounter for therapeutic drug level monitoring: Secondary | ICD-10-CM

## 2016-06-02 DIAGNOSIS — B029 Zoster without complications: Secondary | ICD-10-CM

## 2016-06-02 DIAGNOSIS — I10 Essential (primary) hypertension: Secondary | ICD-10-CM

## 2016-06-02 LAB — POCT CBC
GRANULOCYTE PERCENT: 79.3 % (ref 37–80)
HCT, POC: 43 % (ref 37.7–47.9)
Hemoglobin: 14.9 g/dL (ref 12.2–16.2)
Lymph, poc: 1.2 (ref 0.6–3.4)
MCH: 28 pg (ref 27–31.2)
MCHC: 34.7 g/dL (ref 31.8–35.4)
MCV: 80.8 fL (ref 80–97)
MID (cbc): 0.2 (ref 0–0.9)
MPV: 7.5 fL (ref 0–99.8)
PLATELET COUNT, POC: 203 10*3/uL (ref 142–424)
POC Granulocyte: 5.5 (ref 2–6.9)
POC LYMPH PERCENT: 17.7 %L (ref 10–50)
POC MID %: 3 %M (ref 0–12)
RBC: 5.32 M/uL (ref 4.04–5.48)
RDW, POC: 14 %
WBC: 6.9 10*3/uL (ref 4.6–10.2)

## 2016-06-02 LAB — BASIC METABOLIC PANEL
BUN: 30 mg/dL — ABNORMAL HIGH (ref 7–25)
CALCIUM: 8.9 mg/dL (ref 8.6–10.4)
CHLORIDE: 102 mmol/L (ref 98–110)
CO2: 29 mmol/L (ref 20–31)
CREATININE: 0.91 mg/dL (ref 0.50–1.05)
GLUCOSE: 99 mg/dL (ref 65–99)
Potassium: 4.5 mmol/L (ref 3.5–5.3)
Sodium: 139 mmol/L (ref 135–146)

## 2016-06-02 MED ORDER — LISINOPRIL-HYDROCHLOROTHIAZIDE 20-12.5 MG PO TABS: 1.0000 | ORAL_TABLET | Freq: Every day | ORAL | Status: DC

## 2016-06-02 MED ORDER — LISINOPRIL-HYDROCHLOROTHIAZIDE 20-12.5 MG PO TABS: 1.0000 | ORAL_TABLET | Freq: Every day | ORAL | Status: AC

## 2016-06-02 MED ORDER — ACYCLOVIR 800 MG PO TABS: 800.0000 mg | ORAL_TABLET | Freq: Every day | ORAL | Status: AC

## 2016-06-02 MED ORDER — HYDROCODONE-ACETAMINOPHEN 5-325 MG PO TABS: 1.0000 | ORAL_TABLET | ORAL | Status: DC | PRN

## 2016-06-02 NOTE — Progress Notes (Signed)
Subjective:  By signing my name below, I, Felicia Jenkins, attest that this documentation has been prepared under the direction and in the presence of Felicia SorensonEva Manveer Gomes, MD. Electronically Signed: Stann Oresung-Kai Jenkins, Scribe. 06/02/2016 , 10:26 AM .  Patient was seen in Room 10 .   Patient ID: Felicia Jenkins, female    DOB: 07/13/1961, 55 y.o.   MRN: [id number] Chief Complaint  Patient presents with  . Rash    ON CHEST AREA AND POSS BACK   HPI Felicia Jenkins is a 55 y.o. female who [facility] complaining of burning rash that was noticed 4 days ago. Patient states she felt like "area is burning" with heat into her eyes and face. She's been taking allergy medications without relief. She's also tried taking advil without relief.   She had renal function check on 05/04/16, prior to starting lisinopril hctz a month ago. She denies urinary symptoms, myalgia or cough with medication.   Patient's primary language is Spanish and an interpreter was called.   History reviewed. No pertinent past medical history. Prior to Admission medications   Medication Sig Start Date End Date Taking? Authorizing Provider  augmented betamethasone dipropionate (DIPROLENE) 0.05 % ointment Apply topically 2 (two) times daily. To knee.  Do not apply longer than 2 weeks. 05/04/16  Yes Collie SiadStephanie D English, PA  cetirizine (ZYRTEC) 10 MG tablet Take 1 tablet (10 mg total) by mouth daily. 05/04/16  Yes Collie SiadStephanie D English, PA  lisinopril-hydrochlorothiazide (ZESTORETIC) 20-12.5 MG tablet Take 1 tablet by mouth daily. 05/07/16  Yes Morrell RiddleSarah L Weber, PA-C  montelukast (SINGULAIR) 10 MG tablet Take 1 tablet (10 mg total) by mouth at bedtime. 05/04/16  Yes Collie SiadStephanie D English, PA  mupirocin ointment (BACTROBAN) 2 % Place 1 application into the nose 2 (two) times daily. Apply to scalp. 05/04/16  Yes Stephanie D English, PA  Triamcinolone Acetonide 0.025 % LOTN Apply 1 application topically daily. To face.  Avoid eyelids.  No longer than 2 weeks.  05/04/16  Yes Collie SiadStephanie D English, PA   Allergies  Allergen Reactions  . Penicillins Palpitations    Review of Systems  Constitutional: Negative for fever, chills and fatigue.  Respiratory: Negative for cough, shortness of breath and wheezing.   Gastrointestinal: Negative for nausea and vomiting.  Genitourinary: Negative for dysuria, frequency and hematuria.  Musculoskeletal: Negative for myalgias.  Skin: Positive for rash.       Objective:   Physical Exam  Constitutional: She is oriented to person, place, and time. She appears well-developed and well-nourished. No distress.  HENT:  Head: Normocephalic and atraumatic.  Eyes: EOM are normal. Pupils are equal, round, and reactive to light.  Neck: Neck supple.  Cardiovascular: Normal rate, regular rhythm and normal heart sounds.   No murmur heard. Pulmonary/Chest: Effort normal and breath sounds normal. No respiratory distress.  Musculoskeletal: Normal range of motion.  Neurological: She is alert and oriented to person, place, and time.  Skin: Skin is warm and dry.  Grouped nodules and papules with some ulceration and some crusting grouped on erythematous base, clustered over right breast right beneath right axilla, right thoracic back , none crossing midline, exquisitely tender  Psychiatric: She has a normal mood and affect. Her behavior is normal.  Nursing note and vitals reviewed.  BP 136/82 mmHg  Pulse 82  Temp(Src) 98 F (36.7 C) (Oral)  Resp 18  Ht 4\' 11"  (1.499 m)  Wt 225 lb (102.059 kg)  BMI 45.42 kg/m2  SpO2 96%  Results for orders placed or performed in visit on 06/02/16  POCT CBC  Result Value Ref Range   WBC 6.9 4.6 - 10.2 K/uL   Lymph, poc 1.2 0.6 - 3.4   POC LYMPH PERCENT 17.7 10 - 50 %L   MID (cbc) 0.2 0 - 0.9   POC MID % 3.0 0 - 12 %M   POC Granulocyte 5.5 2 - 6.9   Granulocyte percent 79.3 37 - 80 %G   RBC 5.32 4.04 - 5.48 M/uL   Hemoglobin 14.9 12.2 - 16.2 g/dL   HCT, POC 82.943.0 56.237.7 - 47.9 %   MCV  80.8 80 - 97 fL   MCH, POC 28.0 27 - 31.2 pg   MCHC 34.7 31.8 - 35.4 g/dL   RDW, POC 13.014.0 %   Platelet Count, POC 203 142 - 424 K/uL   MPV 7.5 0 - 99.8 fL      Assessment & Plan:   1. Herpes zoster   2. Benign essential HTN   3. Medication monitoring encounter   BP well controlled. Just started lisinopril-hctz last mo - nml cmp prior to starting - recheck Cr and K.  Orders Placed This Encounter  Procedures  . Basic metabolic panel    Order Specific Question:  Has the patient fasted?    Answer:  No  . POCT CBC    Meds ordered this encounter  Medications  . DISCONTD: lisinopril-hydrochlorothiazide (ZESTORETIC) 20-12.5 MG tablet    Sig: Take 1 tablet by mouth daily.    Dispense:  90 tablet    Refill:  1  . acyclovir (ZOVIRAX) 800 MG tablet    Sig: Take 1 tablet (800 mg total) by mouth 5 (five) times daily.    Dispense:  35 tablet    Refill:  0  . lisinopril-hydrochlorothiazide (ZESTORETIC) 20-12.5 MG tablet    Sig: Take 1 tablet by mouth daily.    Dispense:  90 tablet    Refill:  3  . HYDROcodone-acetaminophen (NORCO/VICODIN) 5-325 MG tablet    Sig: Take 1-2 tablets by mouth every 4 (four) hours as needed for moderate pain.    Dispense:  60 tablet    Refill:  0    I personally performed the services described in this documentation, which was scribed in my presence. The recorded information has been reviewed and considered, and addended by me as needed.   Felicia SorensonEva Vida Nicol, M.D.  [facility]  [place] [address] [place], [place] [address] [phone] phone [phone](336) [phone] fax  06/02/2016 1:56 PM

## 2016-06-02 NOTE — Patient Instructions (Addendum)
IF you received an x-ray today, you will receive an invoice from [facility]. Please contact [facility] at [phone] with questions or concerns regarding your invoice.   IF you received labwork today, you will receive an invoice from [facility]. Please contact [facility] at [phone] with questions or concerns regarding your invoice.   Our billing staff will not be able to assist you with questions regarding bills from these companies.  You will be contacted with the lab results as soon as they are available. The fastest way to get your results is to activate your My Chart account. Instructions are located on the last page of this paperwork. If you have not heard from us regarding the results in 2 weeks, please contact this office.    We recommend that you schedule a mammogram for breast cancer screening. Typically, you do not need a referral to do this. Please contact a local imaging center to schedule your mammogram.  [facility] - [phone](336) [phone]  *ask for the Radiology Department [facility] - [phone]) [phone] or [phone]) [phone]  [facility] - [phone]) [phone] [facility] - [phone]) [phone] [facility] - [phone]) [phone]  *ask for the Radiology Department [facility] - [phone]) [phone]  *ask for the Radiology Department [facility] - [phone]) [phone]  *ask for the Mammography Department [facility] - [phone]) [phone]   [place]brilla (Shingles) La culebrilla, tambin conocida como herpes zster, es una infeccin que causa una erupcin dolorosa en la piel y ampollas llenas de lquido. La culebrilla no est relacionada con el herpes genital, que es una enfermedad de transmisin sexual.   Solo se manifiesta en personas que:  Tuvieron varicela.  Recibieron la vacuna contra la varicela. (Esto es poco frecuente). CAUSAS La causa de la culebrilla es el  virus de la varicela zster (VVZ), el mismo virus que causa la varicela. Despus de la exposicin al virus de la varicela zster, este permanece en el organismo en un estado inactivo (latente). La culebrilla aparece si el virus se reactiva. Esto puede ocurrir muchos aos despus de la exposicin inicial al virus. No se conocen las causas por las que este virus se reactiva. FACTORES DE RIESGO Las personas que tuvieron varicela o recibieron la vacuna contra la varicela estn en riesgo de tener culebrilla. La infeccin es ms frecuente en las personas que:  Tienen ms de 50aos.  Tienen el sistema de defensa del organismo (sistema inmunitario) debilitado, como en los enfermos con VIH, sida o cncer.  Toman medicamentos que debilitan el sistema inmunitario, como los medicamentos para trasplantes.  Estn sometidas a un gran estrs. SNTOMAS Los primeros sntomas de esta afeccin pueden incluir picazn, hormigueo o dolor en una zona de la piel. Este dolor se puede describir como ardor, punzante o pulstil. Unos das o semanas despus de que comienzan los sntomas, aparece una erupcin rojiza y dolorosa en un lado del cuerpo en un patrn con forma de cinto o de banda. Finalmente, la erupcin se convierte en ampollas llenas de lquido que se abren, forman costras y se secan en dos o tres [place]manas. En cualquier momento durante la infeccin, puede presentar lo siguiente:  Grant RutsFiebre.  Escalofros.  Dolor de Turkmenistancabeza.  Malestar estomacal. DIAGNSTICO Esta afeccin se diagnostica con un examen de la piel. En algunos casos, se extraen muestras de piel o del lquido de las ampollas antes de definir el diagnstico. Estas muestras se examinan con el microscopio  o se envan al laboratorio para su anlisis. TRATAMIENTO No hay una cura especfica para esta afeccin. El mdico probablemente le recete medicamentos para ayudarlo a Human resources officercontrolar el dolor, a recuperarse ms rpido y a Physiological scientistevitar problemas a Air cabin crewlargo plazo. Entre  los medicamentos se incluyen los siguientes:  Medicamentos antivirales.  Antiinflamatorios.  Analgsicos. Si la zona afectada est en el rostro, podrn derivarlo a un especialista, como un mdico especialista en ojos (oftalmlogo) o en odos, nariz y Advertising copywritergarganta (otorrinolaringlogo) para evitar problemas oculares, dolor crnico o discapacidad. INSTRUCCIONES PARA EL CUIDADO EN EL HOGAR Medicamentos  Tome los medicamentos solamente como se lo haya indicado el mdico.  Aplique una crema anestsica o una para calmar la picazn en la zona afectada segn las indicaciones del mdico. Cuidado de la erupcin y las ampollas  Tome un bao de agua fra o aplique compresas fras en la zona de la erupcin o las ampollas como se lo haya indicado el mdico. Esto aliviar el dolor y Higher education careers adviserla picazn.  Mantenga la zona de la erupcin cubierta con una venda (vendaje). Use ropa holgada para ayudar a Engineer, materialsaliviar el dolor del roce con la erupcin.  Mantenga la erupcin y las ampollas limpias con jabn suave y agua fresca o como se lo indique el mdico.  Controle la erupcin todos los [address]  [address] para detectar signos de infeccin. Estos signos incluyen enrojecimiento, hinchazn y dolor que perdura o [place].  No pellizque las ampollas.  No se rasque la zona de la erupcin. Instrucciones generales  Haga reposo segn las indicaciones del mdico.  Concurra a todas las visitas de control como se lo haya indicado el mdico. Esto es importante.  Hasta tanto las ampollas formen costras, la infeccin puede causar varicela en las personas que nunca la tuvieron o no se vacunaron contra la varicela. Para impedir que esto suceda, evite el contacto con Nucor Corporationotras personas, en especial:  Bebs.  Embarazadas.  Nios que Radio broadcast assistanttienen eccema.  Personas mayores que han recibido un trasplante.  Personas con enfermedades crnicas, como leucemia y sida. SOLICITE ATENCIN MDICA SI:  El dolor no se alivia con los CHS Incmedicamentos prescritos.  El  dolor no mejora despus de que la erupcin desaparece.  La erupcin parece infectada. Los signos de infeccin incluyen enrojecimiento, hinchazn y dolor que perdura o [place]. SOLICITE ATENCIN MDICA DE INMEDIATO SI:  La erupcin aparece en el rostro o la nariz.  Tiene dolor en el rostro, en la zona de los ojos o tiene prdida de la sensibilidad en un lado del rostro.  Siente dolor o un zumbido en el odo.  Tiene prdida del gusto.  La afeccin empeora.   Esta informacin no tiene Theme park managercomo fin reemplazar el consejo del mdico. Asegrese de hacerle al mdico cualquier pregunta que tenga.   Document Released: 08/29/2005 Document Revised: 12/10/2014 Elsevier Interactive Patient Education [employer]2016 Elsevier Inc.

## 2016-06-06 ENCOUNTER — Encounter (HOSPITAL_COMMUNITY): Payer: Self-pay

## 2016-06-06 ENCOUNTER — Emergency Department (HOSPITAL_COMMUNITY)
Admission: EM | Admit: 2016-06-06 | Discharge: 2016-06-06 | Disposition: A | Discharge status: Home / Self Care | Payer: Self-pay | Attending: Emergency Medicine | Admitting: Emergency Medicine

## 2016-06-06 DIAGNOSIS — B029 Zoster without complications: Secondary | ICD-10-CM

## 2016-06-06 DIAGNOSIS — Z79899 Other long term (current) drug therapy: Secondary | ICD-10-CM | POA: Insufficient documentation

## 2016-06-06 MED ORDER — OXYCODONE-ACETAMINOPHEN 5-325 MG PO TABS
1.0000 | ORAL_TABLET | Freq: Once | ORAL | Status: AC
  Administered 2016-06-06: 1 via ORAL
  Filled 2016-06-06: qty 1

## 2016-06-06 MED ORDER — OXYCODONE-ACETAMINOPHEN 5-325 MG PO TABS: 2.0000 | ORAL_TABLET | ORAL | Status: DC | PRN

## 2016-06-06 MED ORDER — VALACYCLOVIR HCL 1 G PO TABS: 1000.0000 mg | ORAL_TABLET | Freq: Three times a day (TID) | ORAL | Status: AC

## 2016-06-06 NOTE — Progress Notes (Signed)
Interpreter Wyvonnia DuskyGraciela Namihira for Arrow ElectronicsSamantha Tripp

## 2016-06-06 NOTE — Discharge Instructions (Signed)
Culebrilla (Shingles) La culebrilla, tambin conocida como herpes zster, es una infeccin que causa una erupcin dolorosa en la piel y ampollas llenas de lquido. La culebrilla no est relacionada con el herpes genital, que es una enfermedad de transmisin sexual.   Solo se manifiesta en personas que:  Tuvieron varicela.  Recibieron la vacuna contra la varicela. (Esto es poco frecuente). CAUSAS La causa de la culebrilla es el virus de la varicela zster (VVZ), el mismo virus que causa la varicela. Despus de la exposicin al virus de la varicela zster, este permanece en el organismo en un estado inactivo (latente). La culebrilla aparece si el virus se reactiva. Esto puede ocurrir muchos aos despus de la exposicin inicial al virus. No se conocen las causas por las que este virus se reactiva. FACTORES DE RIESGO Las personas que tuvieron varicela o recibieron la vacuna contra la varicela estn en riesgo de tener culebrilla. La infeccin es ms frecuente en las personas que:  Tienen ms de 50aos.  Tienen el sistema de defensa del organismo (sistema inmunitario) debilitado, como en los enfermos con VIH, sida o cncer.  Toman medicamentos que debilitan el sistema inmunitario, como los medicamentos para trasplantes.  Estn sometidas a un gran estrs. SNTOMAS Los primeros sntomas de esta afeccin pueden incluir picazn, hormigueo o dolor en una zona de la piel. Este dolor se puede describir como ardor, punzante o pulstil. Unos das o semanas despus de que comienzan los sntomas, aparece una erupcin rojiza y dolorosa en un lado del cuerpo en un patrn con forma de cinto o de banda. Finalmente, la erupcin se convierte en ampollas llenas de lquido que se abren, forman costras y se secan en dos o tres [place]as. En cualquier momento durante la infeccin, puede presentar lo siguiente:  Grant RutsFiebre.  Escalofros.  Dolor de Turkmenistancabeza.  Malestar estomacal. DIAGNSTICO Esta afeccin se  diagnostica con un examen de la piel. En algunos casos, se extraen muestras de piel o del lquido de las ampollas antes de definir el diagnstico. Estas muestras se examinan con el microscopio o se envan al laboratorio para su anlisis. TRATAMIENTO No hay una cura especfica para esta afeccin. El mdico probablemente le recete medicamentos para ayudarlo a Human resources officercontrolar el dolor, a recuperarse ms rpido y a Physiological scientistevitar problemas a Air cabin crewlargo plazo. Entre los medicamentos se incluyen los siguientes:  Medicamentos antivirales.  Antiinflamatorios.  Analgsicos. Si la zona afectada est en el rostro, podrn derivarlo a un especialista, como un mdico especialista en ojos (oftalmlogo) o en odos, nariz y Advertising copywritergarganta (otorrinolaringlogo) para evitar problemas oculares, dolor crnico o discapacidad. INSTRUCCIONES PARA EL CUIDADO EN EL HOGAR Medicamentos  Tome los medicamentos solamente como se lo haya indicado el mdico.  Aplique una crema anestsica o una para calmar la picazn en la zona afectada segn las indicaciones del mdico. Cuidado de la erupcin y las ampollas  Tome un bao de agua fra o aplique compresas fras en la zona de la erupcin o las ampollas como se lo haya indicado el mdico. Esto aliviar el dolor y Higher education careers adviserla picazn.  Mantenga la zona de la erupcin cubierta con una venda (vendaje). Use ropa holgada para ayudar a Engineer, materialsaliviar el dolor del roce con la erupcin.  Mantenga la erupcin y las ampollas limpias con jabn suave y agua fresca o como se lo indique el mdico.  Controle la erupcin todos los [address]  [address] para detectar signos de infeccin. Estos signos incluyen enrojecimiento, hinchazn y dolor que perdura o [place].  No pellizque las ampollas.  No  se rasque la zona de la erupcin. Instrucciones generales  Haga reposo segn las indicaciones del mdico.  Concurra a todas las visitas de control como se lo haya indicado el mdico. Esto es importante.  Hasta tanto las ampollas formen costras, la  infeccin puede causar varicela en las personas que nunca la tuvieron o no se vacunaron contra la varicela. Para impedir que esto suceda, evite el contacto con Nucor Corporationotras personas, en especial:  Bebs.  Embarazadas.  Nios que Radio broadcast assistanttienen eccema.  Personas mayores que han recibido un trasplante.  Personas con enfermedades crnicas, como leucemia y sida. SOLICITE ATENCIN MDICA SI:  El dolor no se alivia con los CHS Incmedicamentos prescritos.  El dolor no mejora despus de que la erupcin desaparece.  La erupcin parece infectada. Los signos de infeccin incluyen enrojecimiento, hinchazn y dolor que perdura o [place]. SOLICITE ATENCIN MDICA DE INMEDIATO SI:  La erupcin aparece en el rostro o la nariz.  Tiene dolor en el rostro, en la zona de los ojos o tiene prdida de la sensibilidad en un lado del rostro.  Siente dolor o un zumbido en el odo.  Tiene prdida del gusto.  La afeccin empeora.   Esta informacin no tiene Theme park managercomo fin reemplazar el consejo del mdico. Asegrese de hacerle al mdico cualquier pregunta que tenga.  Discontinue Acyclovir and take Valacyclovir instead. Take Valacyclovir 3 times a day for 10 days. Take percocet for pain. Follow up with your primary care for re-evaluation. Return to the ED if you experience fevers, difficulty breathing, pain in eyes, rash on face. Avoid exposure to other people and especially pregnant females and children.

## 2016-06-06 NOTE — ED Notes (Signed)
Patient here with rash to breast and back x 8 days, severe pain to rash. Minimal drainage from same.

## 2016-06-06 NOTE — ED Provider Notes (Signed)
CSN: [id number]     Arrival date & time 06/06/16  [id number] History  By signing my name below, I, Freida Busmaniana Omoyeni, attest that this documentation has been prepared under the direction and in the presence of non-physician practitioner, Gaylyn RongSamantha Gabriela Giannelli, PA-C. Electronically Signed: Freida Busmaniana Omoyeni, Scribe. 06/06/2016. 9:38 AM.     Chief Complaint  Patient presents with  . Rash    The history is provided by the patient. A language interpreter was used.    HPI Comments:  Felicia Jenkins is a 55 y.o. female who presents to the Emergency Department complaining of a painful, red,  rash over her right chest/breast and back x 7 days. She reports moderate, constant, pain to the site. Pt was diagnosed with shingles on 06/02/16 at an Urgent care. She was prescribed Acyclovir, and Vicodin which she has been taking without relief. Her last dose of Vicodin was this AM. Pt has no other physical complaints at this time.   History reviewed. No pertinent past medical history. History reviewed. No pertinent past surgical history. No family history on file. Social History  Substance Use Topics  . Smoking status: Never Smoker   . Smokeless tobacco: Never Used  . Alcohol Use: No   OB History    No data available     Review of Systems   10 systems reviewed and all are negative for acute change except as noted in the HPI.  Allergies  Penicillins  Home Medications   Prior to Admission medications   Medication Sig Start Date End Date Taking? Authorizing Provider  acyclovir (ZOVIRAX) 800 MG tablet Take 1 tablet (800 mg total) by mouth 5 (five) times daily. 06/02/16 06/09/16  Sherren MochaEva N Shaw, MD  augmented betamethasone dipropionate (DIPROLENE) 0.05 % ointment Apply topically 2 (two) times daily. To knee.  Do not apply longer than 2 weeks. 05/04/16   Collie SiadStephanie D English, PA  cetirizine (ZYRTEC) 10 MG tablet Take 1 tablet (10 mg total) by mouth daily. 05/04/16   Garnetta BuddyStephanie D English, PA  HYDROcodone-acetaminophen  (NORCO/VICODIN) 5-325 MG tablet Take 1-2 tablets by mouth every 4 (four) hours as needed for moderate pain. 06/02/16   Sherren MochaEva N Shaw, MD  lisinopril-hydrochlorothiazide (ZESTORETIC) 20-12.5 MG tablet Take 1 tablet by mouth daily. 06/02/16   Sherren MochaEva N Shaw, MD  montelukast (SINGULAIR) 10 MG tablet Take 1 tablet (10 mg total) by mouth at bedtime. 05/04/16   Collie SiadStephanie D English, PA  mupirocin ointment (BACTROBAN) 2 % Place 1 application into the nose 2 (two) times daily. Apply to scalp. 05/04/16   Collie SiadStephanie D English, PA  oxyCODONE-acetaminophen (PERCOCET/ROXICET) 5-325 MG tablet Take 2 tablets by mouth every 4 (four) hours as needed for severe pain. 06/06/16   Meridian Scherger Tripp Kida Digiulio, PA-C  Triamcinolone Acetonide 0.025 % LOTN Apply 1 application topically daily. To face.  Avoid eyelids.  No longer than 2 weeks. 05/04/16   Collie SiadStephanie D English, PA  valACYclovir (VALTREX) 1000 MG tablet Take 1 tablet (1,000 mg total) by mouth 3 (three) times daily. 06/06/16 06/20/16  Aftyn Nott Tripp Estell Puccini, PA-C   BP 117/92 mmHg  Pulse 78  Temp(Src) 97.9 F (36.6 C) (Oral)  Resp 16  SpO2 99% Physical Exam  Constitutional: She is oriented to person, place, and time. She appears well-developed and well-nourished. No distress.  HENT:  Head: Normocephalic and atraumatic.  Eyes: Conjunctivae are normal. Right eye exhibits no discharge. Left eye exhibits no discharge. No scleral icterus.  Cardiovascular: Normal rate.   Pulmonary/Chest: Effort normal.  Neurological: She is  alert and oriented to person, place, and time. Coordination normal.  Skin: Skin is warm and dry. Rash noted. She is not diaphoretic. No pallor.  Dermatomal erythematous and vesicular rash along right upper back and right breast   Psychiatric: She has a normal mood and affect. Her behavior is normal.  Nursing note and vitals reviewed.   ED Course  Procedures   DIAGNOSTIC STUDIES:  Oxygen Saturation is 99% on RA, normal by my interpretation.    COORDINATION OF  CARE:  9:24 AM Discussed treatment plan with pt at bedside with the help of language line and pt agreed to plan.   MDM   Final diagnoses:  Shingles    Patient with herpes zoster. Patient will be discharged with Valtrex and additional pain medication. No signs of secondary infection. No signs of disseminated herpes. No rash on face. Follow up with PCP in 2-3 days. Return precautions discussed. Pt is safe for discharge at this time.    I personally performed the services described in this documentation, which was scribed in my presence. The recorded information has been reviewed and is accurate.     Lester KinsmanSamantha Tripp RemerDowless, PA-C 06/06/16 1009  Glynn OctaveStephen Rancour, MD 06/06/16 1536

## 2016-06-14 ENCOUNTER — Encounter (HOSPITAL_COMMUNITY): Payer: Self-pay | Admitting: Emergency Medicine

## 2016-06-14 ENCOUNTER — Emergency Department (HOSPITAL_COMMUNITY)
Admission: EM | Admit: 2016-06-14 | Discharge: 2016-06-14 | Disposition: A | Discharge status: Home / Self Care | Payer: Self-pay | Attending: Emergency Medicine | Admitting: Emergency Medicine

## 2016-06-14 DIAGNOSIS — I10 Essential (primary) hypertension: Secondary | ICD-10-CM | POA: Insufficient documentation

## 2016-06-14 DIAGNOSIS — B029 Zoster without complications: Secondary | ICD-10-CM | POA: Insufficient documentation

## 2016-06-14 DIAGNOSIS — M549 Dorsalgia, unspecified: Secondary | ICD-10-CM | POA: Insufficient documentation

## 2016-06-14 HISTORY — DX: Zoster without complications: B02.9

## 2016-06-14 MED ORDER — OXYCODONE-ACETAMINOPHEN 5-325 MG PO TABS: 1.0000 | ORAL_TABLET | Freq: Four times a day (QID) | ORAL | Status: DC | PRN

## 2016-06-14 MED ORDER — HYDROMORPHONE HCL 1 MG/ML IJ SOLN
1.0000 mg | Freq: Once | INTRAMUSCULAR | Status: AC
  Administered 2016-06-14: 1 mg via INTRAMUSCULAR
  Filled 2016-06-14: qty 1

## 2016-06-14 MED ORDER — ONDANSETRON 4 MG PO TBDP
4.0000 mg | ORAL_TABLET | Freq: Once | ORAL | Status: AC
  Administered 2016-06-14: 4 mg via ORAL
  Filled 2016-06-14: qty 1

## 2016-06-14 MED ORDER — POLYETHYLENE GLYCOL 3350 17 GM/SCOOP PO POWD: 17.0000 g | Freq: Every day | ORAL | Status: DC

## 2016-06-14 MED ORDER — DOCUSATE SODIUM 100 MG PO CAPS: 100.0000 mg | ORAL_CAPSULE | Freq: Two times a day (BID) | ORAL | Status: AC

## 2016-06-14 NOTE — Discharge Instructions (Signed)
Please read and follow all provided instructions.  Your diagnoses today include:  1. Herpes zoster    Tests performed today include:  Vital signs. See below for your results today.   Medications prescribed:   Percocet (oxycodone/acetaminophen) - narcotic pain medication  DO NOT drive or perform any activities that require you to be awake and alert because this medicine can make you drowsy. BE VERY CAREFUL not to take multiple medicines containing Tylenol (also called acetaminophen). Doing so can lead to an overdose which can damage your liver and cause liver failure and possibly death.   Miralax - laxative  This medication can be found over-the-counter.    Colace - stool softener  This medication can be found over-the-counter.   Take any prescribed medications only as directed.  Home care instructions:  Follow any educational materials contained in this packet.  BE VERY CAREFUL not to take multiple medicines containing Tylenol (also called acetaminophen). Doing so can lead to an overdose which can damage your liver and cause liver failure and possibly death.   Follow-up instructions: Please follow-up with your primary care provider in the next 3 days for further evaluation of your symptoms.   Return instructions:   Please return to the Emergency Department if you experience worsening symptoms.   Please return if you have any other emergent concerns.  Additional Information:  Your vital signs today were: BP 149/74 mmHg   Pulse 57   Temp(Src) 98.1 F (36.7 C) (Oral)   Wt 102.059 kg   SpO2 93% If your blood pressure (BP) was elevated above 135/85 this visit, please have this repeated by your doctor within one month. --------------

## 2016-06-14 NOTE — ED Provider Notes (Signed)
CSN: [id number]     Arrival date & time 06/14/16  [id number] History   First MD Initiated Contact with Patient 06/14/16 [phone]     Chief Complaint  Patient presents with  . Herpes Zoster  . Pain   (Consider location/radiation/quality/duration/timing/severity/associated sxs/prior Treatment) HPI Comments: Patient presents with c/o R chest and back pain at location of previous shingles infection. Patient has been treated with vicodin/percocet and acyclovir. Patient states she has been having difficulty tolerating her pain medications due to associated constipation. She states that these provide her with only 10-20 minutes of pain relief. Denies other symptoms including fevers, nausea, vomiting, or diarrhea. Patient states that she has been taking her medications. The onset of this condition was acute. The course is constant. Aggravating factors: deep breathing, movement and palpation. Alleviating factors: none.    The history is provided by the patient and medical records. The history is limited by a language barrier. A language interpreter was used (telephone).    Past Medical History  Diagnosis Date  . Shingles   . Hypertension    History reviewed. No pertinent past surgical history. No family history on file. Social History  Substance Use Topics  . Smoking status: Never Smoker   . Smokeless tobacco: Never Used  . Alcohol Use: No   OB History    No data available     Review of Systems  Constitutional: Negative for fever.  HENT: Negative for rhinorrhea and sore throat.   Eyes: Negative for redness.  Respiratory: Negative for cough.   Cardiovascular: Negative for chest pain.  Gastrointestinal: Negative for nausea, vomiting, abdominal pain and diarrhea.  Genitourinary: Negative for dysuria.  Musculoskeletal: Positive for myalgias and back pain.  Skin: Positive for rash.  Neurological: Negative for headaches.    Allergies  Penicillins  Home Medications   Prior to Admission  medications   Medication Sig Start Date End Date Taking? Authorizing Provider  augmented betamethasone dipropionate (DIPROLENE) 0.05 % ointment Apply topically 2 (two) times daily. To knee.  Do not apply longer than 2 weeks. 05/04/16   Collie SiadStephanie D English, PA  cetirizine (ZYRTEC) 10 MG tablet Take 1 tablet (10 mg total) by mouth daily. 05/04/16   Garnetta BuddyStephanie D English, PA  HYDROcodone-acetaminophen (NORCO/VICODIN) 5-325 MG tablet Take 1-2 tablets by mouth every 4 (four) hours as needed for moderate pain. 06/02/16   Sherren MochaEva N Shaw, MD  lisinopril-hydrochlorothiazide (ZESTORETIC) 20-12.5 MG tablet Take 1 tablet by mouth daily. 06/02/16   Sherren MochaEva N Shaw, MD  montelukast (SINGULAIR) 10 MG tablet Take 1 tablet (10 mg total) by mouth at bedtime. 05/04/16   Collie SiadStephanie D English, PA  mupirocin ointment (BACTROBAN) 2 % Place 1 application into the nose 2 (two) times daily. Apply to scalp. 05/04/16   Collie SiadStephanie D English, PA  oxyCODONE-acetaminophen (PERCOCET/ROXICET) 5-325 MG tablet Take 2 tablets by mouth every 4 (four) hours as needed for severe pain. 06/06/16   Samantha Tripp Dowless, PA-C  Triamcinolone Acetonide 0.025 % LOTN Apply 1 application topically daily. To face.  Avoid eyelids.  No longer than 2 weeks. 05/04/16   Collie SiadStephanie D English, PA  valACYclovir (VALTREX) 1000 MG tablet Take 1 tablet (1,000 mg total) by mouth 3 (three) times daily. 06/06/16 06/20/16  Samantha Tripp Dowless, PA-C   BP 153/81 mmHg  Pulse 63  Temp(Src) 98.1 F (36.7 C) (Oral)  Wt 102.059 kg  SpO2 97%   Physical Exam  Constitutional: She appears well-developed and well-nourished. She appears distressed (Patient is crying).  HENT:  Head: Normocephalic and atraumatic.  Eyes: Conjunctivae are normal. Right eye exhibits no discharge. Left eye exhibits no discharge.  Neck: Normal range of motion. Neck supple.  Cardiovascular: Normal rate, regular rhythm and normal heart sounds.   Pulmonary/Chest: Effort normal and breath sounds normal.  Abdominal: Soft.  There is no tenderness.  Neurological: She is alert.  Skin: Skin is warm and dry.  There is a resolving rash over her right breast, lateral chest wall, mid back consistent with shingles.  Psychiatric: She has a normal mood and affect.  Nursing note and vitals reviewed.   ED Course  Procedures (including critical care time)  8:41 AM Patient seen and examined. Patient is crying and obviously very frustrated at being in pain. Will give IM dilaudid here to give her some relief. Will need continued opioid treatment for the current time given her continued symptoms. Will start on bowel regimen with miralax and colace. She will need PCP follow-up.   Vital signs reviewed and are as follows: BP 153/81 mmHg  Pulse 63  Temp(Src) 98.1 F (36.7 C) (Oral)  Wt 102.059 kg  SpO2 97%  9:42 AM Pain now controlled. Instructed patient to take pain medication prior to the Dilaudid wearing off.  Discharge to home with Percocet, Colace, MiraLAX.  Patient counseled on use of narcotic pain medications. Counseled not to combine these medications with others containing tylenol. Urged not to drink alcohol, drive, or perform any other activities that requires focus while taking these medications. The patient verbalizes understanding and agrees with the plan.  Encouraged PCP follow-up for recheck in the next 1 week. Return to the emergency department with worsening or uncontrolled symptoms.   MDM   Final diagnoses:  Herpes zoster   Patient with resolving herpes zoster however continues to be in severe pain. Ability to take pain medications is complicated by constipation. Acute pain control achieved in emergency department. Will continue oral opioids for severe pain and start patient on bowel regimen. No secondary infection suspected.    Renne Crigler, PA-C 06/14/16 1610  Rolland Porter, MD 06/23/16 [phone]

## 2016-06-14 NOTE — ED Notes (Signed)
Pt seen here on 06/06/16 -- dx with shingles-- c/o severe pain in area-- pt speaks Spanish only- has been taking her medicine.

## 2016-06-16 ENCOUNTER — Emergency Department (HOSPITAL_COMMUNITY)
Admission: EM | Admit: 2016-06-16 | Discharge: 2016-06-16 | Disposition: A | Discharge status: Home / Self Care | Payer: Self-pay | Attending: Emergency Medicine | Admitting: Emergency Medicine

## 2016-06-16 ENCOUNTER — Encounter (HOSPITAL_COMMUNITY): Payer: Self-pay | Admitting: Emergency Medicine

## 2016-06-16 DIAGNOSIS — Z550 Illiteracy and low-level literacy: Secondary | ICD-10-CM

## 2016-06-16 DIAGNOSIS — I10 Essential (primary) hypertension: Secondary | ICD-10-CM | POA: Insufficient documentation

## 2016-06-16 DIAGNOSIS — B0222 Postherpetic trigeminal neuralgia: Secondary | ICD-10-CM | POA: Insufficient documentation

## 2016-06-16 DIAGNOSIS — B0229 Other postherpetic nervous system involvement: Secondary | ICD-10-CM

## 2016-06-16 MED ORDER — OXYCODONE-ACETAMINOPHEN 5-325 MG PO TABS
2.0000 | ORAL_TABLET | Freq: Once | ORAL | Status: DC
  Filled 2016-06-16: qty 2

## 2016-06-16 MED ORDER — NAPROXEN 500 MG PO TABS: 500.0000 mg | ORAL_TABLET | Freq: Two times a day (BID) | ORAL | Status: DC

## 2016-06-16 MED ORDER — OXYCODONE HCL 5 MG PO TABS: 2.5000 mg | ORAL_TABLET | Freq: Four times a day (QID) | ORAL | Status: DC | PRN

## 2016-06-16 MED ORDER — ONDANSETRON 4 MG PO TBDP
4.0000 mg | ORAL_TABLET | Freq: Once | ORAL | Status: DC
  Filled 2016-06-16: qty 1

## 2016-06-16 NOTE — Discharge Instructions (Signed)
Neuralgia postherptica (Postherpetic Neuralgia) La neuralgia postherptica (NPH) es un dolor neural que aparece despus de tener culebrilla. La culebrilla es una erupcin cutnea dolorosa que aparece en un lado del cuerpo, generalmente en el tronco o el rostro. Es causada por el virus de la varicela zoster, el mismo virus que causa la varicela. En las personas que tuvieron varicela, el virus puede reaparecer aos ms tarde y causar culebrilla. Puede tener neuralgia postherptica si sigue teniendo dolor durante 3meses despus de la desaparicin de la erupcin de la culebrilla. La neuralgia postherptica aparece en la misma zona donde se produjo la erupcin cutnea de la culebrilla, y, en la mayora de los casos, desaparece en el trmino de 1ao.  La aplicacin de la vacuna contra la culebrilla puede prevenir la neuralgia postherptica. Se recomienda que las personas mayores de 50aos se apliquen esta vacuna, la cual puede prevenir la culebrilla y, adems, reducir el riesgo de neuralgia postherptica si tiene culebrilla. CAUSAS La neuralgia postherptica se debe al dao neural que causa el virus de la varicela zoster. Este dao hipersensibiliza los nervios.  FACTORES DE RIESGO El envejecimiento es el principal factor de riesgo de desarrollar neuralgia postherptica. La mayora de las personas que padecen este trastorno son mayores de 60aos. Otros factores de riesgo son:  Sentir un dolor muy intenso antes de que aparezca la erupcin cutnea de la culebrilla.  Tener una erupcin cutnea muy extensa.  Tener culebrilla en el nervio que inerva el rostro y el ojo (nervio trigmino). SIGNOS Y SNTOMAS El dolor es el sntoma principal de neuralgia postherptica. Suele ser muy intenso y puede describirse como un dolor punzante, que provoca sensacin de quemazn o que se parece a una descarga elctrica. El dolor puede ser intermitente o permanente. Los roces suaves sobre la piel o los cambios de  temperatura pueden desencadenar el dolor. Junto con el dolor, puede tener picazn. DIAGNSTICO  El mdico puede diagnosticar la neuralgia postherptica en funcin de los sntomas y de los antecedentes de culebrilla. Generalmente, no es necesario realizar anlisis de laboratorio ni otras pruebas diagnsticas. TRATAMIENTO  No hay cura para la neuralgia postherptica. El tratamiento se centrar en el alivio del dolor. Generalmente, los analgsicos de venta libre no alivian el dolor que provoca la neuralgia postherptica. Tal vez deba consultar a un especialista en dolor. El tratamiento puede incluir:  Antidepresivos para ayudar con el dolor y mejorar el sueo.  Anticonvulsivos para aliviar el dolor neural.  Analgsicos fuertes (opioides).  Un parche anestsico que se coloca sobre la piel (parche de lidocana). INSTRUCCIONES PARA EL CUIDADO EN EL HOGAR Recuperarse de la neuralgia postherptica puede llevar mucho tiempo. Trabaje en estrecha colaboracin con el mdico y procure tener un buen sistema de apoyo en su casa.   Tome todos los medicamentos como se lo haya indicado el mdico.  Use ropa cmoda y suelta.  Cubra las zonas sensibles con una venda para reducir la friccin de las prendas contra la zona.  Si el fro no empeora el dolor, intente aplicarse una compresa fra o una bolsa con gel para bajar la temperatura en la zona.  Hable con el mdico si est deprimido o desesperado. Convivir con un dolor crnico puede causar depresin. SOLICITE ATENCIN MDICA SI:  El medicamento no le calma el dolor.  Tiene dificultades para controlar el dolor en su casa.   Esta informacin no tiene como fin reemplazar el consejo del mdico. Asegrese de hacerle al mdico cualquier pregunta que tenga.   Document Released: 03/07/2009   Document Revised: 12/10/2014 Elsevier Interactive Patient Education 2016 Elsevier Inc.  

## 2016-06-16 NOTE — ED Notes (Signed)
PA at bedside.

## 2016-06-16 NOTE — Care Management Note (Signed)
Case Management Note  Patient Details  Name: Felicia Jenkins MRN: [id number] Date of Birth: 06/04/1961  Subjective/Objective:   55 y.o. Spanish Speaking  F  Seen in the ED for the third time this month for Herpetic Neuralgia. Pt has had Shingles and although it is healed now she continues to have pain that is unbearable and she seeks care in the ED. Pt does not speak [place] and does not read [place] or [place]. Utilizing [employer], CM able [facility]/2016 @ 08:30 when there are open clinic hours and pts are seen on a  first come first served basis. Pt instructed, in writing, per her request, to ask for 1) Navigator, 2) PCP, and 3) follow up healthcare. Pt and spouse appreciative of assistance and communicated that they would go to clinic on Thursday at specified time. No further CM needs.                Action/Plan: Discharge   Expected Discharge Date:                  Expected Discharge Plan:     In-House Referral:  PCP / [facility]  Discharge planning Services  CM Consult, Follow-up appt scheduled  Post Acute Care Choice:    Choice offered to:  Patient, Spouse  DME Arranged:    DME Agency:     HH Arranged:    HH Agency:     Status of Service:  Completed, signed off  If discussed at Long Length of Stay Meetings, dates discussed:    Additional Comments:  Yvone NeuCrutchfield, Jett Fukuda M, RN 06/16/2016, 11:38 AM

## 2016-06-16 NOTE — ED Notes (Signed)
Pt reports dx shingles x 3 weeks and states out of pain meds.  Rash noted to upper and lower back and chest.

## 2016-06-16 NOTE — ED Notes (Signed)
Pt reports taking 3 "pain pills" this am, declines more meds at this time.

## 2016-06-16 NOTE — ED Provider Notes (Signed)
CSN: [id number]     Arrival date & time 06/16/16  0913 History   First MD Initiated Contact with Patient 06/16/16 [phone]     Chief Complaint  Patient presents with  . Medication Refill  . Herpes Zoster     (Consider location/radiation/quality/duration/timing/severity/associated sxs/prior Treatment) HPI   Felicia Jenkins is a(n) 55 y.o. female who presents to the ED with cc of pain. She is spanish speaking and translation is utilized. She was diagnosed with shingles on 06/06/2016. This is her 3rd visit for rash and pain. She states that her pain is uncontrolled. Patient states that she is taking up to 3 pain medicines at a time, however, she hands. The nurse a bottle of lisinopril. Patient is unable to read. Her husband also does not read[place] The patient does not have a doctor and when asked who give her he medications she states "[facility]." She has been coming to the emergency department for all of her acute health needs. The patient states that her rash is improving, but the pain is still severe..  Past Medical History  Diagnosis Date  . Shingles   . Hypertension    History reviewed. No pertinent past surgical history. No family history on file. Social History  Substance Use Topics  . Smoking status: Never Smoker   . Smokeless tobacco: Never Used  . Alcohol Use: No   OB History    No data available     Review of Systems  Ten systems reviewed and are negative for acute change, except as noted in the HPI.    Allergies  Penicillins  Home Medications   Prior to Admission medications   Medication Sig Start Date End Date Taking? Authorizing Provider  augmented betamethasone dipropionate (DIPROLENE) 0.05 % ointment Apply topically 2 (two) times daily. To knee.  Do not apply longer than 2 weeks. 05/04/16   Collie Siad English, PA  cetirizine (ZYRTEC) 10 MG tablet Take 1 tablet (10 mg total) by mouth daily. 05/04/16   Collie Siad English, PA  docusate sodium  (COLACE) 100 MG capsule Take 1 capsule (100 mg total) by mouth every 12 (twelve) hours. 06/14/16   Renne Crigler, PA-C  HYDROcodone-acetaminophen (NORCO/VICODIN) 5-325 MG tablet Take 1-2 tablets by mouth every 4 (four) hours as needed for moderate pain. 06/02/16   Sherren Mocha, MD  lisinopril-hydrochlorothiazide (ZESTORETIC) 20-12.5 MG tablet Take 1 tablet by mouth daily. 06/02/16   Sherren Mocha, MD  montelukast (SINGULAIR) 10 MG tablet Take 1 tablet (10 mg total) by mouth at bedtime. 05/04/16   Collie Siad English, PA  mupirocin ointment (BACTROBAN) 2 % Place 1 application into the nose 2 (two) times daily. Apply to scalp. 05/04/16   Collie Siad English, PA  naproxen (NAPROSYN) 500 MG tablet Take 1 tablet (500 mg total) by mouth 2 (two) times daily with a meal. 06/16/16   Arthor Captain, PA-C  oxyCODONE (OXY IR/ROXICODONE) 5 MG immediate release tablet Take 0.5-1 tablets (2.5-5 mg total) by mouth every 6 (six) hours as needed for severe pain. 06/16/16   Arthor Captain, PA-C  oxyCODONE-acetaminophen (PERCOCET/ROXICET) 5-325 MG tablet Take 1-2 tablets by mouth every 6 (six) hours as needed for severe pain. 06/14/16   Renne Crigler, PA-C  polyethylene glycol powder (GLYCOLAX/MIRALAX) powder Take 17 g by mouth daily. 06/14/16   Renne Crigler, PA-C  Triamcinolone Acetonide 0.025 % LOTN Apply 1 application topically daily. To face.  Avoid eyelids.  No longer than 2 weeks. 05/04/16  Collie SiadStephanie D English, PA  valACYclovir (VALTREX) 1000 MG tablet Take 1 tablet (1,000 mg total) by mouth 3 (three) times daily. 06/06/16 06/20/16  Samantha Tripp Dowless, PA-C   BP 94/71 mmHg  Pulse 60  Temp(Src) 98 F (36.7 C) (Oral)  Resp 14  SpO2 96% Physical Exam  Constitutional: She is oriented to person, place, and time. She appears well-developed and well-nourished. No distress.  HENT:  Head: Normocephalic and atraumatic.  Eyes: Conjunctivae are normal. No scleral icterus.  Neck: Normal range of motion.  Cardiovascular: Normal rate,  regular rhythm and normal heart sounds.  Exam reveals no gallop and no friction rub.   No murmur heard. Pulmonary/Chest: Effort normal and breath sounds normal. No respiratory distress.  Abdominal: Soft. Bowel sounds are normal. She exhibits no distension and no mass. There is no tenderness. There is no guarding.  Neurological: She is alert and oriented to person, place, and time.  Skin: Skin is warm and dry. She is not diaphoretic.  Well healing rash on the right upper chest and shoulder blade. All lesions are crusted and well healing.    ED Course  Procedures (including critical care time) Labs Review Labs Reviewed - No data to display  Imaging Review No results found. I have personally reviewed and evaluated these images and lab results as part of my medical decision-making.   EKG Interpretation None      MDM   Final diagnoses:  Post herpetic neuralgia  Illiterate    Patient seen by case management. [facility]. Express directions given to the patient on follow up care and proper medications. Patient appears safe for discharge at this time.  Arthor Captainbigail May Ozment, PA-C 06/16/16 1239  Glynn OctaveStephen Rancour, MD 06/16/16 [phone]

## 2016-06-16 NOTE — ED Notes (Signed)
Case manager at bedside 

## 2016-07-04 ENCOUNTER — Emergency Department (HOSPITAL_COMMUNITY)
Admission: EM | Admit: 2016-07-04 | Discharge: 2016-07-04 | Disposition: A | Discharge status: Home / Self Care | Payer: Self-pay | Attending: Emergency Medicine | Admitting: Emergency Medicine

## 2016-07-04 ENCOUNTER — Encounter (HOSPITAL_COMMUNITY): Payer: Self-pay

## 2016-07-04 DIAGNOSIS — I10 Essential (primary) hypertension: Secondary | ICD-10-CM | POA: Insufficient documentation

## 2016-07-04 DIAGNOSIS — B0229 Other postherpetic nervous system involvement: Secondary | ICD-10-CM

## 2016-07-04 DIAGNOSIS — B0222 Postherpetic trigeminal neuralgia: Secondary | ICD-10-CM | POA: Insufficient documentation

## 2016-07-04 MED ORDER — KETOROLAC TROMETHAMINE 60 MG/2ML IM SOLN
60.0000 mg | Freq: Once | INTRAMUSCULAR | Status: AC
  Administered 2016-07-04: 60 mg via INTRAMUSCULAR
  Filled 2016-07-04: qty 2

## 2016-07-04 MED ORDER — GABAPENTIN 300 MG PO CAPS: ORAL_CAPSULE | ORAL | 0 refills | Status: DC

## 2016-07-04 MED ORDER — NAPROXEN 500 MG PO TABS: 500.0000 mg | ORAL_TABLET | Freq: Two times a day (BID) | ORAL | 0 refills | Status: DC

## 2016-07-04 NOTE — ED Notes (Signed)
PT is in stable condition upon d/c and ambulates from ED. 

## 2016-07-04 NOTE — ED Triage Notes (Signed)
Pt reports she has right breast and shoulder pain X1 month. She reports he pain starts in her shoulder and radiates into her right breast.

## 2016-07-04 NOTE — ED Notes (Signed)
Called Graciela, Spanish interpretor, to help interpret for E40.

## 2016-07-04 NOTE — ED Provider Notes (Signed)
[facility]-EMERGENCY DEPT Provider Note   CSN: [id number] Arrival date & time: 07/04/16  7829  First Provider Contact:  First MD Initiated Contact with Patient 07/04/16 [phone]        History   Chief Complaint Chief Complaint  Patient presents with  . Breast Pain    HPI Felicia Jenkins is a 55 y.o. female presenting with right breast pain. Patient states the pain has been going on for 2 months. History is taken with the aid of a Spanish interpretor her at the bedside. Patient states that the pain has been present for about 2 months but is getting worse over the last 2 weeks. Patient ran out of her pain medicines yesterday. Has had nothing today. Patient mostly has pain over where the rash is still present. Her breast feels hot but she has not noticed any redness or swelling. No nipple drainage. No fevers. Was referred to the [facility] but states her ID is not up-to-date and so she cannot go yet. Pain is a burning sensation.  HPI  Past Medical History:  Diagnosis Date  . Hypertension   . Shingles     Patient Active Problem List   Diagnosis Date Noted  . Benign essential HTN 05/07/2016    History reviewed. No pertinent surgical history.  OB History    No data available       Home Medications    Prior to Admission medications   Medication Sig Start Date End Date Taking? Authorizing Provider  augmented betamethasone dipropionate (DIPROLENE) 0.05 % ointment Apply topically 2 (two) times daily. To knee.  Do not apply longer than 2 weeks. 05/04/16   Collie Siad English, PA  cetirizine (ZYRTEC) 10 MG tablet Take 1 tablet (10 mg total) by mouth daily. 05/04/16   Collie Siad English, PA  docusate sodium (COLACE) 100 MG capsule Take 1 capsule (100 mg total) by mouth every 12 (twelve) hours. 06/14/16   Renne Crigler, PA-C  gabapentin (NEURONTIN) 300 MG capsule Day 1: 300 mg x 1 Day 2: 300 mg BID Day 3-14 300 mg TID 07/04/16   Pricilla Loveless, MD  HYDROcodone-acetaminophen  (NORCO/VICODIN) 5-325 MG tablet Take 1-2 tablets by mouth every 4 (four) hours as needed for moderate pain. 06/02/16   Sherren Mocha, MD  lisinopril-hydrochlorothiazide (ZESTORETIC) 20-12.5 MG tablet Take 1 tablet by mouth daily. 06/02/16   Sherren Mocha, MD  montelukast (SINGULAIR) 10 MG tablet Take 1 tablet (10 mg total) by mouth at bedtime. 05/04/16   Collie Siad English, PA  mupirocin ointment (BACTROBAN) 2 % Place 1 application into the nose 2 (two) times daily. Apply to scalp. 05/04/16   Collie Siad English, PA  naproxen (NAPROSYN) 500 MG tablet Take 1 tablet (500 mg total) by mouth 2 (two) times daily with a meal. 07/04/16   Pricilla Loveless, MD  oxyCODONE (OXY IR/ROXICODONE) 5 MG immediate release tablet Take 0.5-1 tablets (2.5-5 mg total) by mouth every 6 (six) hours as needed for severe pain. 06/16/16   Arthor Captain, PA-C  oxyCODONE-acetaminophen (PERCOCET/ROXICET) 5-325 MG tablet Take 1-2 tablets by mouth every 6 (six) hours as needed for severe pain. 06/14/16   Renne Crigler, PA-C  polyethylene glycol powder (GLYCOLAX/MIRALAX) powder Take 17 g by mouth daily. 06/14/16   Renne Crigler, PA-C  Triamcinolone Acetonide 0.025 % LOTN Apply 1 application topically daily. To face.  Avoid eyelids.  No longer than 2 weeks. 05/04/16   Garnetta Buddy, PA    Family History History reviewed.  No pertinent family history.  Social History Social History  Substance Use Topics  . Smoking status: Never Smoker  . Smokeless tobacco: Never Used  . Alcohol use No     Allergies   Penicillins   Review of Systems Review of Systems  Constitutional: Negative for fever.  Respiratory: Negative for shortness of breath.   Cardiovascular: Negative for chest pain.  Gastrointestinal: Negative for abdominal pain.  All other systems reviewed and are negative.    Physical Exam Updated Vital Signs BP 118/88 (BP Location: Right Arm)   Pulse 66   Temp 98.4 F (36.9 C) (Oral)   Resp 18   Ht 4\' 11"  (1.499 m)   Wt 223 lb  6.4 oz (101.3 kg)   SpO2 97%   BMI 45.12 kg/m   Physical Exam  Constitutional: She is oriented to person, place, and time. She appears well-developed and well-nourished. No distress.  HENT:  Head: Normocephalic and atraumatic.  Right Ear: External ear normal.  Left Ear: External ear normal.  Nose: Nose normal.  Eyes: Right eye exhibits no discharge. Left eye exhibits no discharge.  Cardiovascular: Normal rate, regular rhythm and normal heart sounds.   Pulmonary/Chest: Effort normal and breath sounds normal.    No erythema or palpable abscess on breast  Abdominal: Soft. There is no tenderness.  Musculoskeletal:       Back:  Neurological: She is alert and oriented to person, place, and time.  Skin: Skin is warm and dry. She is not diaphoretic.  Nursing note and vitals reviewed.    ED Treatments / Results  Labs (all labs ordered are listed, but only abnormal results are displayed) Labs Reviewed - No data to display  EKG  EKG Interpretation None       Radiology No results found.  Procedures Procedures (including critical care time)  Medications Ordered in ED Medications  ketorolac (TORADOL) injection 60 mg (not administered)     Initial Impression / Assessment and Plan / ED Course  I have reviewed the triage vital signs and the nursing notes.  Pertinent labs & imaging results that were available during my care of the patient were reviewed by me and considered in my medical decision making (see chart for details).  Clinical Course    Patient appears to be having postherpetic neuralgia. At this point, will give IM Toradol, give NSAIDs again, and start on Neurontin as I think her primary issue is neuropathic pain. Discussed the importance of needing follow-up as this can be a chronic condition. She states she will go get a new ID and follow up with PCP as soon as possible.  Final Clinical Impressions(s) / ED Diagnoses   Final diagnoses:  HZV (herpes zoster  virus) post herpetic neuralgia    New Prescriptions New Prescriptions   GABAPENTIN (NEURONTIN) 300 MG CAPSULE    Day 1: 300 mg x 1 Day 2: 300 mg BID Day 3-14 300 mg TID   NAPROXEN (NAPROSYN) 500 MG TABLET    Take 1 tablet (500 mg total) by mouth 2 (two) times daily with a meal.     Pricilla Loveless, MD 07/04/16 1010

## 2016-07-04 NOTE — Progress Notes (Signed)
Interpreter Graciela Namihira for MD °

## 2016-07-17 ENCOUNTER — Encounter: Payer: Self-pay | Admitting: Family Medicine

## 2016-08-31 ENCOUNTER — Emergency Department (HOSPITAL_COMMUNITY): Payer: Self-pay

## 2016-08-31 ENCOUNTER — Encounter (HOSPITAL_COMMUNITY): Payer: Self-pay | Admitting: Emergency Medicine

## 2016-08-31 ENCOUNTER — Other Ambulatory Visit: Payer: Self-pay

## 2016-08-31 ENCOUNTER — Observation Stay (HOSPITAL_COMMUNITY)
Admission: EM | Admit: 2016-08-31 | Discharge: 2016-09-02 | Disposition: A | Discharge status: Home / Self Care | Payer: Self-pay | Attending: Family Medicine | Admitting: Family Medicine

## 2016-08-31 DIAGNOSIS — B0229 Other postherpetic nervous system involvement: Principal | ICD-10-CM

## 2016-08-31 DIAGNOSIS — R0609 Other forms of dyspnea: Secondary | ICD-10-CM

## 2016-08-31 DIAGNOSIS — E7800 Pure hypercholesterolemia, unspecified: Secondary | ICD-10-CM | POA: Insufficient documentation

## 2016-08-31 DIAGNOSIS — R778 Other specified abnormalities of plasma proteins: Secondary | ICD-10-CM | POA: Diagnosis present

## 2016-08-31 DIAGNOSIS — Z791 Long term (current) use of non-steroidal anti-inflammatories (NSAID): Secondary | ICD-10-CM | POA: Insufficient documentation

## 2016-08-31 DIAGNOSIS — R7303 Prediabetes: Secondary | ICD-10-CM | POA: Insufficient documentation

## 2016-08-31 DIAGNOSIS — N644 Mastodynia: Secondary | ICD-10-CM | POA: Insufficient documentation

## 2016-08-31 DIAGNOSIS — E669 Obesity, unspecified: Secondary | ICD-10-CM | POA: Insufficient documentation

## 2016-08-31 DIAGNOSIS — Z7982 Long term (current) use of aspirin: Secondary | ICD-10-CM | POA: Insufficient documentation

## 2016-08-31 DIAGNOSIS — R748 Abnormal levels of other serum enzymes: Secondary | ICD-10-CM | POA: Insufficient documentation

## 2016-08-31 DIAGNOSIS — Z79899 Other long term (current) drug therapy: Secondary | ICD-10-CM | POA: Insufficient documentation

## 2016-08-31 DIAGNOSIS — R7989 Other specified abnormal findings of blood chemistry: Secondary | ICD-10-CM | POA: Diagnosis present

## 2016-08-31 DIAGNOSIS — F329 Major depressive disorder, single episode, unspecified: Secondary | ICD-10-CM | POA: Insufficient documentation

## 2016-08-31 DIAGNOSIS — I110 Hypertensive heart disease with heart failure: Secondary | ICD-10-CM | POA: Insufficient documentation

## 2016-08-31 DIAGNOSIS — Z6841 Body Mass Index (BMI) 40.0 and over, adult: Secondary | ICD-10-CM | POA: Insufficient documentation

## 2016-08-31 DIAGNOSIS — E785 Hyperlipidemia, unspecified: Secondary | ICD-10-CM | POA: Insufficient documentation

## 2016-08-31 DIAGNOSIS — I509 Heart failure, unspecified: Secondary | ICD-10-CM | POA: Insufficient documentation

## 2016-08-31 DIAGNOSIS — R5383 Other fatigue: Secondary | ICD-10-CM

## 2016-08-31 DIAGNOSIS — R079 Chest pain, unspecified: Secondary | ICD-10-CM | POA: Diagnosis present

## 2016-08-31 LAB — CBC WITH DIFFERENTIAL/PLATELET
BASOS ABS: 0 10*3/uL (ref 0.0–0.1)
BASOS PCT: 0 %
EOS ABS: 0.3 10*3/uL (ref 0.0–0.7)
Eosinophils Relative: 3 %
HEMATOCRIT: 38.2 % (ref 36.0–46.0)
Hemoglobin: 12.8 g/dL (ref 12.0–15.0)
Lymphocytes Relative: 19 %
Lymphs Abs: 1.8 10*3/uL (ref 0.7–4.0)
MCH: 29.7 pg (ref 26.0–34.0)
MCHC: 33.5 g/dL (ref 30.0–36.0)
MCV: 88.6 fL (ref 78.0–100.0)
MONO ABS: 0.8 10*3/uL (ref 0.1–1.0)
MONOS PCT: 9 %
NEUTROS ABS: 6.6 10*3/uL (ref 1.7–7.7)
NEUTROS PCT: 69 %
Platelets: 259 10*3/uL (ref 150–400)
RBC: 4.31 MIL/uL (ref 3.87–5.11)
RDW: 13.5 % (ref 11.5–15.5)
WBC: 9.5 10*3/uL (ref 4.0–10.5)

## 2016-08-31 LAB — CBC
HCT: 36.7 % (ref 36.0–46.0)
HEMOGLOBIN: 11.9 g/dL — AB (ref 12.0–15.0)
MCH: 28.9 pg (ref 26.0–34.0)
MCHC: 32.4 g/dL (ref 30.0–36.0)
MCV: 89.1 fL (ref 78.0–100.0)
PLATELETS: 246 10*3/uL (ref 150–400)
RBC: 4.12 MIL/uL (ref 3.87–5.11)
RDW: 13.5 % (ref 11.5–15.5)
WBC: 8.8 10*3/uL (ref 4.0–10.5)

## 2016-08-31 LAB — COMPREHENSIVE METABOLIC PANEL
ALK PHOS: 58 U/L (ref 38–126)
ALT: 28 U/L (ref 14–54)
ANION GAP: 8 (ref 5–15)
AST: 24 U/L (ref 15–41)
Albumin: 3.7 g/dL (ref 3.5–5.0)
BILIRUBIN TOTAL: 1 mg/dL (ref 0.3–1.2)
BUN: 21 mg/dL — ABNORMAL HIGH (ref 6–20)
CALCIUM: 9.4 mg/dL (ref 8.9–10.3)
CO2: 24 mmol/L (ref 22–32)
CREATININE: 0.89 mg/dL (ref 0.44–1.00)
Chloride: 106 mmol/L (ref 101–111)
GFR calc non Af Amer: >60 mL/min (ref >60)
GLUCOSE: 101 mg/dL — AB (ref 65–99)
Potassium: 3.8 mmol/L (ref 3.5–5.1)
Sodium: 138 mmol/L (ref 135–145)
TOTAL PROTEIN: 6.7 g/dL (ref 6.5–8.1)

## 2016-08-31 LAB — TROPONIN I
TROPONIN I: 0.08 ng/mL — AB (ref <0.03)
TROPONIN I: 0.08 ng/mL — AB (ref <0.03)
Troponin I: 0.09 ng/mL (ref <0.03)

## 2016-08-31 LAB — BRAIN NATRIURETIC PEPTIDE: B NATRIURETIC PEPTIDE 5: 119.8 pg/mL — AB (ref 0.0–100.0)

## 2016-08-31 LAB — CREATININE, SERUM
CREATININE: 0.81 mg/dL (ref 0.44–1.00)
GFR calc Af Amer: >60 mL/min (ref >60)

## 2016-08-31 LAB — TSH: TSH: 2.467 u[IU]/mL (ref 0.350–4.500)

## 2016-08-31 MED ORDER — PANTOPRAZOLE SODIUM 40 MG PO TBEC
40.0000 mg | DELAYED_RELEASE_TABLET | Freq: Every day | ORAL | Status: DC
  Administered 2016-09-01 – 2016-09-02 (×2): 40 mg via ORAL
  Filled 2016-08-31 (×2): qty 1

## 2016-08-31 MED ORDER — MORPHINE SULFATE (PF) 2 MG/ML IV SOLN: 2.0000 mg | INTRAVENOUS | Status: DC | PRN

## 2016-08-31 MED ORDER — ACETAMINOPHEN 325 MG PO TABS
650.0000 mg | ORAL_TABLET | ORAL | Status: DC | PRN
  Administered 2016-08-31: 650 mg via ORAL
  Filled 2016-08-31: qty 2

## 2016-08-31 MED ORDER — ASPIRIN EC 325 MG PO TBEC
325.0000 mg | DELAYED_RELEASE_TABLET | Freq: Every day | ORAL | Status: DC
  Administered 2016-08-31 – 2016-09-02 (×3): 325 mg via ORAL
  Filled 2016-08-31 (×3): qty 1

## 2016-08-31 MED ORDER — METOPROLOL TARTRATE 25 MG PO TABS
25.0000 mg | ORAL_TABLET | Freq: Two times a day (BID) | ORAL | Status: DC
  Administered 2016-08-31 – 2016-09-02 (×4): 25 mg via ORAL
  Filled 2016-08-31 (×5): qty 1

## 2016-08-31 MED ORDER — ATORVASTATIN CALCIUM 40 MG PO TABS
40.0000 mg | ORAL_TABLET | Freq: Every day | ORAL | Status: DC
  Administered 2016-09-01: 40 mg via ORAL
  Filled 2016-08-31: qty 1

## 2016-08-31 MED ORDER — HEPARIN SODIUM (PORCINE) 5000 UNIT/ML IJ SOLN
5000.0000 [IU] | Freq: Three times a day (TID) | INTRAMUSCULAR | Status: DC
Start: 2016-08-31 — End: 2016-09-02
  Administered 2016-08-31 – 2016-09-02 (×5): 5000 [IU] via SUBCUTANEOUS
  Filled 2016-08-31 (×3): qty 1

## 2016-08-31 MED ORDER — ONDANSETRON HCL 4 MG/2ML IJ SOLN: 4.0000 mg | Freq: Four times a day (QID) | INTRAMUSCULAR | Status: DC | PRN

## 2016-08-31 MED ORDER — GABAPENTIN 600 MG PO TABS
300.0000 mg | ORAL_TABLET | Freq: Every day | ORAL | Status: DC
  Administered 2016-08-31: 300 mg via ORAL
  Filled 2016-08-31: qty 1

## 2016-08-31 MED ORDER — ONDANSETRON 4 MG PO TBDP: 4.0000 mg | ORAL_TABLET | Freq: Three times a day (TID) | ORAL | Status: DC | PRN

## 2016-08-31 NOTE — ED Notes (Signed)
Pt up to bathroom ambulatory without any problems

## 2016-08-31 NOTE — Consult Note (Signed)
CARDIOLOGY CONSULT NOTE  Patient ID: Felicia Jenkins MRN: [id number] DOB/AGE: October 11, 1961 55 y.o.  Admit date: 08/31/2016 Primary Physician None Primary Cardiologist Non Chief Complaint  Feel fatigued   Requesting  Trixie Dredge PAc  HPI:  The patient has no past cardiac history.  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  She has had gradual weight gain and increased dyspnea.  She has had generalized swelling and multiple aches and pains.  She has had persistent nausea and vomitting.  She has had questionable PND and orthopnea.  She came to the ED because she got tired of feeling bad.  She does not have a PCP and she has not been taking any of the meds listed below.  She did mention some pain in her upper mid back and pain under her breasts.  She says this pain is constant times months and "more than" 10/10.  It is gripping.  She does not describe radiation.  Nothing makes it worse or better.  In the ED initial trop was slightly elevated and BNP mildly elevated.    Please note that this interview was conducted with a video interpreter.  She is Spanish speaking only.     Past Medical History:  Diagnosis Date  . Hypertension    untreated  . Obesity     Past Surgical History:  Procedure Laterality Date  . ABDOMINAL HYSTERECTOMY    . APPENDECTOMY    . OVARIAN CYST REMOVAL      Allergies  Allergen Reactions  . Penicillins Shortness Of Breath   Prior to Admission medications   Medication Sig Start Date End Date Taking? Authorizing Provider  acetaminophen (TYLENOL) 325 MG tablet Take 650 mg by mouth every 6 (six) hours as needed for mild pain.   Yes Historical Provider, MD  ibuprofen (ADVIL,MOTRIN) 200 MG tablet Take 200 mg by mouth every 6 (six) hours as needed for pain.   Yes Historical Provider, MD  aspirin 81 MG EC tablet Take 1 tablet (81 mg total) by  mouth daily. Patient not taking: Reported on 08/31/2016 03/27/13   Myrlene Broker, MD  citalopram (CELEXA) 20 MG tablet Take 1 tablet (20 mg total) by mouth daily. 03/27/13 03/27/14  Myrlene Broker, MD  lisinopril-hydrochlorothiazide (PRINZIDE,ZESTORETIC) 20-25 MG per tablet Take 1 tablet by mouth daily. Patient not taking: Reported on 08/31/2016 03/27/13   Myrlene Broker, MD  lovastatin (MEVACOR) 20 MG tablet Take 1 tablet (20 mg total) by mouth daily. 03/27/13 03/27/14  Myrlene Broker, MD   (Note she has not been taking her medications.)   (Not in a hospital admission) Family History  Problem Relation Age of Onset  . Arthritis Mother   . Leukemia Father     Social History   Social History  . Marital status: Married    Spouse name: N/A  . Number of children: N/A  . Years of education: N/A   Occupational History  . Not on file.   Social History Main Topics  . Smoking status: Never Smoker  . Smokeless tobacco: Never Used  . Alcohol use No  . Drug use: No  . Sexual activity: Not on file   Other Topics Concern  . Not on file   Social History Narrative   Lives in [place] with husband.       ROS:  As stated in the HPI and negative for all other systems.  Physical Exam: Blood pressure 133/73, pulse 70, temperature 97.9 F (36.6 C), temperature source Oral, resp. rate 20, height 4\' 9"  (1.448 m), weight 230 lb (104.3 kg), SpO2 100 %.  GENERAL:  Well appearing HEENT:  Pupils equal round and reactive, fundi not visualized, oral mucosa unremarkable NECK:  No jugular venous distention, waveform within normal limits, carotid upstroke brisk and symmetric, no bruits, no thyromegaly LYMPHATICS:  No cervical, inguinal adenopathy LUNGS:  Clear to auscultation bilaterally BACK:  No CVA tenderness CHEST:  Unremarkable HEART:  PMI not displaced or sustained,S1 and S2 within normal limits, no S3, no S4, no clicks, no rubs,  murmurs ABD:  Flat, positive bowel sounds  normal in frequency in pitch, no bruits, no rebound, no guarding, no midline pulsatile mass, no hepatomegaly, no splenomegaly, obese EXT:  2 plus pulses throughout, no edema, no cyanosis no clubbing SKIN:  No rashes no nodules NEURO:  Cranial nerves II through XII grossly intact, motor grossly intact throughout PSYCH:  Cognitively intact, oriented to person place and time   Labs: Lab Results  Component Value Date   BUN 21 (H) 08/31/2016   Lab Results  Component Value Date   CREATININE 0.89 08/31/2016   Lab Results  Component Value Date   NA 138 08/31/2016   K 3.8 08/31/2016   CL 106 08/31/2016   CO2 24 08/31/2016   Lab Results  Component Value Date   TROPONINI 0.08 (HH) 08/31/2016   Lab Results  Component Value Date   WBC 9.5 08/31/2016   HGB 12.8 08/31/2016   HCT 38.2 08/31/2016   MCV 88.6 08/31/2016   PLT 259 08/31/2016   Lab Results  Component Value Date   CHOL 232 (H) 01/29/2013   HDL 55 01/29/2013   LDLCALC 160 (H) 01/29/2013   TRIG 83 01/29/2013   CHOLHDL 4.2 01/29/2013   Lab Results  Component Value Date   ALT 28 08/31/2016   AST 24 08/31/2016   ALKPHOS 58 08/31/2016   BILITOT 1.0 08/31/2016      Radiology:   CXR: The heart is borderline enlarged but stable. Mild tortuosity of the thoracic aorta with minimal calcification. Low lung volumes with vascular crowding and streaky atelectasis. Mild chronic bronchitic changes appears stable. The bony thorax is intact.  EKG:NSR, rate 89, lateral T wave inversion, consider ischemia.  Poor anterior R wave progression.  Could not exclude ischemia.  08/31/2016  ASSESSMENT AND PLAN:   CHEST PAIN:  Unusual for angina.  However, she has an abnormal EKG and borderline enzymes.  I have suggested admission for rule out and echo.  Pending these results I would like suggest a noninvasive evaluation.  However, she is not sure at this point that she wants to be admitted.    OBESITY:   She needs a primary provider.  She  needs risk reduction.   She should have an A1C  HYPERLIPIDEMIA:  She should first have diet and consideration of statin if her levels stay this high or if she is found to have CAD.     HTN:   BP currently controlled.   I would restart half of her previous does of lisinopril at discharge.    SignedRollene Rotunda: Bethani Brugger 08/31/2016, 3:22 PM

## 2016-08-31 NOTE — ED Triage Notes (Signed)
Patient states she was diagnosed with varicella x 3 months ago and continues to have R breast and R under arm pain.   Patient states she got pain pills from her doctor, but she ran out and wants more.   Patient states "I want to get more pain pills and check to see if I have cancer".   Patient denies other symptoms at this time.

## 2016-08-31 NOTE — ED Notes (Signed)
Elevated Tropinin 0.08 - Trixie DredgeEmily West PA notified.

## 2016-08-31 NOTE — H&P (Signed)
Family Medicine Teaching [facility] Admission History and Physical Service Pager: [phone]  Patient name: Felicia Jenkins Medical record number: [id number] Date of birth: [birth year] Age: 55 y.o. Gender: female  Primary Care Provider: No PCP Per Patient Consultants: Cardiology Code Status: FULL  Chief Complaint: Chest Pain, Fatigue  Assessment and Plan: Felicia Jenkins is a 55 y.o. female presenting with fatigue and right breast pain of 1 month duration. PMH is significant for obesity, HTN, HLD, depression, and shingles (3 months ago involving right breast). * NOTE there are two charts in Epic. In process of merging records. Both charts reviewed. Ginny Forth, [place])   #Right-Sided Chest Pain: onset 3 months after shingles outbreak, worsening over the last month. Other symptoms include diaphoresis, SOB, orthopnea, PND, edema, worse with activity. Pain is similar to what she experienced during shingles outbreak. Does note occasional radiation down left arm. CP better with rest. HEART score 3. Troponin elevated but stable at 0.08>0.09. EKG did not show ST changes but noted T-wave inversions consistent with previous EKGs. No previous h/o CP or catheterization. BNP mildly elevated to 119.8. Last ECHO 2014 showed LVEF 60-65% with mod-severe LVH. Cardiology following. CP may be related to untreated shingles being that she was not treated for this. Cardiac etiology is possible but no obvious signs of ischemia at this time. Heart failure is likely given her symptoms of orthopnea, PND, SOB, and leg swelling. No formal diagnosis of heart failure in past. Also question GI etiology of pain given nausea, vomiting. - Admit to obs for ACS r/o, attending Dr. Lum Babe - Cardiology following, appreciate recs - Cardiac monitoring - Repeat EKG in AM - ECHO pending - Risk stratification, pending A1C and lipid panel - Trend troponin - Tylenol q4h PRN mild pain, morphine IV q2h PRN severe  pain - Lopressor 25 mg twice daily during acute workup. Transition to Metoprolol succinate with daily dosing prior to discharge. - Zofran q8h PRN nausea - Aspirin 325 mg daily. Anticipate transition to 81mg  dosing at discharge. - Protonix daily  #Postherpetic Neuropathy: resolving, onset 3 months ago. Completed course of Valacyclovir. Small rash still apparent on right chest wall. Patient continues to experience pain on right breast radiating to scapula. Somewhat complicates acute chest pain picture as patient unable to differentiate cardiac pain from zoster pain.  - Gabapentin 300 mg daily  #Nausea with Non-bloody Vomiting: new onset as of 3 weeks ago. Emesis is non-bloody and always after having food. Maintains normal appetite. Emesis usually occurs around 0600 in the morning. Last episode on 9/29. No nauseous during admission. - Zofran q8h PRN nausea - Protonix daily  #Essential Hypertension: Home medication includes Lisinopril-HCTZ 20-25 mg daily. - Lopressor 25 mg daily. Transition to Metoprolol succinate with daily dosing prior to discharge.  #Hyperlipidemia: last lipid panel 2014 showed hypercholesterolemia 232 and elevated LDL 160. - Lipid panel pending - Atorvastatin 40mg   #Depression: no depressive symptoms at present. Not currently on medication.   # Prediabetes: A1C 6.1 in 01/2013. Not currently on any medication. - Recheck A1C  # Illiterate: Does not read Spanish or English - Typed out instructions at discharge not likely to be helpful. Recommend discussing instructions orally multiple times at discharge.   FEN/GI: Heart healthy diet Prophylaxis: Heparin  Disposition: pending ACS work up  History of Present Illness:  Felicia Jenkins is a 55 y.o. female presenting with fatigue and right breast pain of 1 month duration. PMH is significant for obesity, HTN, HLD, depression, and shingles (3 months ago involving right breast).  Patient is spanish speaking only.  [employer] interpre[employer] was used for this history.   Patient states she came [facility] ED for right-sided chest pain at breast, increased SOB, orthopnea, PND, and fatigue for 1 month duration that is worse with activity. Occasional left arm radiation. She does note diaphoresis with right-sided chest pain which began after experiencing shingles on the right breast 3 months ago which was not treated. The pain she continues to experience is the same as when the she had the shingles outbreak. For the past 3 weeks she has been experiencing intermittent nauseous with episodes of non-bloody emesis every morning around 0600 (last episode was this AM) and says "she feels like she is going to die." Denies h/o fevers, loss of appetite, or abdominal pain. She denies h/o smoking, EtOH use, or illicit drug use. States she does not see the doctor because she is unable to drive due to seat belt irritation and car sickness. Patient was admitted on tele observation for ACS r/o.  Review Of Systems:  ROS per HPI  Patient Active Problem List   Diagnosis Date Noted  . Elevated troponin 08/31/2016  . Depression 03/29/2013  . HLD (hyperlipidemia) 02/02/2013  . HTN (hypertension) 01/29/2013  . Chest pain 01/29/2013    Past Medical History: Past Medical History:  Diagnosis Date  . Hypertension    untreated  . Obesity     Past Surgical History: Past Surgical History:  Procedure Laterality Date  . ABDOMINAL HYSTERECTOMY    . APPENDECTOMY    . OVARIAN CYST REMOVAL      Social History: Social History  Substance Use Topics  . Smoking status: Never Smoker  . Smokeless tobacco: Never Used  . Alcohol use No   Family History: Family History  Problem Relation Age of Onset  . Arthritis Mother   . Leukemia Father    Allergies and Medications: Allergies  Allergen Reactions  . Penicillins Shortness Of Breath   No current facility-administered medications on file prior to encounter.    Current Outpatient  Prescriptions on File Prior to Encounter  Medication Sig Dispense Refill  . ibuprofen (ADVIL,MOTRIN) 200 MG tablet Take 200 mg by mouth every 6 (six) hours as needed for pain.    Marland Kitchen aspirin 81 MG EC tablet Take 1 tablet (81 mg total) by mouth daily. (Patient not taking: Reported on 08/31/2016) 30 tablet 5  . citalopram (CELEXA) 20 MG tablet Take 1 tablet (20 mg total) by mouth daily. 30 tablet 3  . lisinopril-hydrochlorothiazide (PRINZIDE,ZESTORETIC) 20-25 MG per tablet Take 1 tablet by mouth daily. (Patient not taking: Reported on 08/31/2016) 30 tablet 3  . lovastatin (MEVACOR) 20 MG tablet Take 1 tablet (20 mg total) by mouth daily. 30 tablet 3  . [DISCONTINUED] hydrochlorothiazide (MICROZIDE) 12.5 MG capsule Take 2 capsules (25 mg total) by mouth daily. 60 capsule 3    Objective: BP 133/73   Pulse 70   Temp 97.9 F (36.6 C) (Oral)   Resp 20   Ht 4\' 9"  (1.448 m)   Wt 230 lb (104.3 kg)   SpO2 100%   BMI 49.77 kg/m  Exam: General: obese, well developed, in no acute distress sitting up in bed with non-toxic appearance HEENT: normocephalic, atraumatic, moist mucous membranes Neck: supple, non-tender without lymphadenopathy CV: regular rate and rhythm without murmurs, rubs, or gallops Lungs: clear to auscultation bilaterally with normal work of breathing Abdomen: soft, non-tender, no masses or organomegaly palpable, normoactive bowel sounds Skin: warm, dry, small papular-like scars on  right chest wall with dermatomal pattern, cap refill < 2 seconds Extremities: warm and well perfused, normal tone, Edema noted Psych: Very talkative, normal affect  Labs and Imaging: CBC BMET   Recent Labs Lab 08/31/16 1031  WBC 9.5  HGB 12.8  HCT 38.2  PLT 259    Recent Labs Lab 08/31/16 1031  NA 138  K 3.8  CL 106  CO2 24  BUN 21*  CREATININE 0.89  GLUCOSE 101*  CALCIUM 9.4    Troponin: 0.08>0.09 BNP 119.8 A1C:  Pending Lipid Panel:  Pending  Wendee Beaversavid J McMullen, DO 08/31/2016, 7:30  PM PGY-1, [facility] FPTS Intern pager: [phone], text pages welcome  Upper Level Addendum:  I have seen and evaluated this patient along with Dr. Abelardo DieselMcMullen and reviewed the above note, making necessary revisions in blue.   Dr. Garry Heateraleigh Keslyn Teater, DO, PGY2 08/31/2016; 8:35 PM

## 2016-08-31 NOTE — ED Notes (Signed)
Pt st's she is hungry, requesting something to eat.  Family at bedside

## 2016-08-31 NOTE — ED Provider Notes (Signed)
[facility]-EMER[facility]ENCY DEPT Provider Note   CSN: [id number] Arrival date & time: 08/31/16  0920     History   Chief Complaint Chief Complaint  Patient presents with  . Breast Pain    HPI Felicia Jenkins is a 55 y.o. female.  HPI   Patient with hx HTN, obesity with recent reported diagnosis of shingles (approximately 3 months ago) p/w worsening shingles-like pain over her right breast with swelling.  The pain is burning and itching.  She only has relief with lifting her breast.  Has been taking pain medications without relief.  Pt also notes that for the past 3 months she has been extremely fatigued, has SOB and chest pain with walking, feels that she is about to faint while walking, with associated nausea.  She also feels she is swollen all over.  She feels that drinking a pepsi improves her symptoms.  She has not been able to work in 3 months.    Currently without insurance or PCP.  Has an appointment in November to get the orange card.     Past Medical History:  Diagnosis Date  . Hypertension    untreated  . Obesity     Patient Active Problem List   Diagnosis Date Noted  . Elevated troponin 08/31/2016  . Depression 03/29/2013  . HLD (hyperlipidemia) 02/02/2013  . HTN (hypertension) 01/29/2013  . Chest pain 01/29/2013    Past Surgical History:  Procedure Laterality Date  . ABDOMINAL HYSTERECTOMY    . APPENDECTOMY    . OVARIAN CYST REMOVAL      OB History    Gravida Para Term Preterm AB Living   0             SAB TAB Ectopic Multiple Live Births                   Home Medications    Prior to Admission medications   Medication Sig Start Date End Date Taking? Authorizing Provider  acetaminophen (TYLENOL) 325 MG tablet Take 650 mg by mouth every 6 (six) hours as needed for mild pain.   Yes Historical Provider, MD  ibuprofen (ADVIL,MOTRIN) 200 MG tablet Take 200 mg by mouth every 6 (six) hours as needed for pain.   Yes Historical Provider, MD  aspirin  81 MG EC tablet Take 1 tablet (81 mg total) by mouth daily. Patient not taking: Reported on 08/31/2016 03/27/13   Myrlene Broker, MD  citalopram (CELEXA) 20 MG tablet Take 1 tablet (20 mg total) by mouth daily. 03/27/13 03/27/14  Myrlene Broker, MD  lisinopril-hydrochlorothiazide (PRINZIDE,ZESTORETIC) 20-25 MG per tablet Take 1 tablet by mouth daily. Patient not taking: Reported on 08/31/2016 03/27/13   Myrlene Broker, MD  lovastatin (MEVACOR) 20 MG tablet Take 1 tablet (20 mg total) by mouth daily. 03/27/13 03/27/14  Myrlene Broker, MD    Family History Family History  Problem Relation Age of Onset  . Arthritis Mother   . Leukemia Father     Social History Social History  Substance Use Topics  . Smoking status: Never Smoker  . Smokeless tobacco: Never Used  . Alcohol use No     Allergies   Penicillins   Review of Systems Review of Systems  All other systems reviewed and are negative.    Physical Exam Updated Vital Signs BP 133/73   Pulse 70   Temp 97.9 F (36.6 C) (Oral)   Resp 20   Ht 4\' 9"  (1.448 m)   Wt  104.3 kg   SpO2 100%   BMI 49.77 kg/m   Physical Exam  Constitutional: She appears well-developed and well-nourished. No distress.  obese  HENT:  Head: Normocephalic and atraumatic.  Eyes: Conjunctivae and EOM are normal. No scleral icterus.  Neck: Normal range of motion. Neck supple.  Cardiovascular: Normal rate and regular rhythm.   Pulmonary/Chest: Effort normal and breath sounds normal. No respiratory distress. She has no decreased breath sounds. She has no wheezes. She has no rhonchi. She has no rales.  Mild tenderness and light erythema across right breast.  No masses, induration, or fluctuance palpated. No noted nipple discharge.  No rash.    Abdominal: Soft. She exhibits no distension. There is no tenderness. There is no rebound and no guarding.  Neurological: She is alert.  Skin: She is not diaphoretic.  Nursing note and vitals  reviewed.    ED Treatments / Results  Labs (all labs ordered are listed, but only abnormal results are displayed) Labs Reviewed  COMPREHENSIVE METABOLIC PANEL - Abnormal; Notable for the following:       Result Value   Glucose, Bld 101 (*)    BUN 21 (*)    All other components within normal limits  BRAIN NATRIURETIC PEPTIDE - Abnormal; Notable for the following:    B Natriuretic Peptide 119.8 (*)    All other components within normal limits  TROPONIN I - Abnormal; Notable for the following:    Troponin I 0.08 (*)    All other components within normal limits  TROPONIN I - Abnormal; Notable for the following:    Troponin I 0.09 (*)    All other components within normal limits  CBC WITH DIFFERENTIAL/PLATELET  TSH    EKG  EKG Interpretation  Date/Time:  Friday August 31 2016 09:31:53 EDT Ventricular Rate:  89 PR Interval:  180 QRS Duration: 100 QT Interval:  362 QTC Calculation: 440 R Axis:   -21 Text Interpretation:  Normal sinus rhythm with sinus arrhythmia Septal infarct , age undetermined T wave abnormality in a different pattern compared to 2014 Abnormal ekg Confirmed by Gerhard MunchLOCKWOOD, ROBERT  MD [phone]([id number]) on 08/31/2016 11:41:45 AM       Radiology Dg Chest 2 View  Result Date: 08/31/2016 CLINICAL DATA:  Right upper chest pain today. EXAM: CHEST  2 VIEW COMPARISON:  01/31/2013 FINDINGS: The heart is borderline enlarged but stable. Mild tortuosity of the thoracic aorta with minimal calcification. Low lung volumes with vascular crowding and streaky atelectasis. Mild chronic bronchitic changes appears stable. The bony thorax is intact. IMPRESSION: Mild stable cardiac enlargement and tortuosity and calcification of the thoracic aorta. Low lung volumes with vascular crowding and streaky atelectasis. Stable underlying bronchitic changes. Electronically Signed   By: Rudie MeyerP.  Gallerani M.D.   On: 08/31/2016 11:05    Procedures Procedures (including critical care time)  Medications  Ordered in ED Medications - No data to display   Initial Impression / Assessment and Plan / ED Course  I have reviewed the triage vital signs and the nursing notes.  Pertinent labs & imaging results that were available during my care of the patient were reviewed by me and considered in my medical decision making (see chart for details).  Clinical Course  Comment By Time  Cardiology to see patient.   Trixie Dredgemily Saif Peter, PA-C 09/29 1150   Afebrile nontoxic patient with right breast pain that she feels is similar to prior shingles pain.  No palpable mass or fluctuance concerning for abscess. Pt also with  3 months of extreme fatigue, DOE, near syncope while walking.  Labs significant for elevated troponin.  CXR with mildly enlarged heart and tortuous aorta, vascular crowding.  Pt seen by cardiology, Dr Antoine Poche, who recommends admission.  Please see his note for further detail.  Pt admitted to [facility].    Final Clinical Impressions(s) / ED Diagnoses   Final diagnoses:  DOE (dyspnea on exertion)  Other fatigue  Post herpetic neuralgia    New Prescriptions New Prescriptions   No medications on file     Trixie Dredge, PA-C 08/31/16 1921    Gerhard Munch, MD 09/01/16 2318

## 2016-08-31 NOTE — ED Notes (Signed)
Patient transported to X-ray 

## 2016-09-01 DIAGNOSIS — R0789 Other chest pain: Secondary | ICD-10-CM

## 2016-09-01 DIAGNOSIS — R7989 Other specified abnormal findings of blood chemistry: Secondary | ICD-10-CM

## 2016-09-01 LAB — LIPID PANEL
CHOL/HDL RATIO: 4.8 ratio
CHOLESTEROL: 220 mg/dL — AB (ref 0–200)
HDL: 46 mg/dL (ref >40)
LDL Cholesterol: 129 mg/dL — ABNORMAL HIGH (ref 0–99)
Triglycerides: 226 mg/dL — ABNORMAL HIGH (ref <150)
VLDL: 45 mg/dL — AB (ref 0–40)

## 2016-09-01 LAB — TROPONIN I
Troponin I: 0.07 ng/mL (ref <0.03)
Troponin I: 0.08 ng/mL (ref <0.03)

## 2016-09-01 MED ORDER — GABAPENTIN 300 MG PO CAPS
300.0000 mg | ORAL_CAPSULE | Freq: Two times a day (BID) | ORAL | Status: DC
  Administered 2016-09-01 – 2016-09-02 (×3): 300 mg via ORAL
  Filled 2016-09-01 (×4): qty 1

## 2016-09-01 NOTE — Progress Notes (Signed)
Family Medicine Teaching Service Daily Progress Note Intern Pager: [phone]  Patient name: Felicia Jenkins Medical record number: [id number] Date of birth: August 22, 1961 Age: 55 y.o. Gender: female  Primary Care Provider: No PCP Per Patient Consultants: cardiology Code Status: full  Pt Overview and Major Events to Date:  9/29- Admitted to observation for ACS r/o  Assessment and Plan: Felicia Jenkins is a 55 y.o. female presenting with fatigue and right breast pain of 1 month duration. PMH is significant for obesity, HTN, HLD, depression, and shingles (3 months ago involving right breast). * NOTE there are two charts in Epic. In process of merging records. Both charts reviewed. Ginny Forth, [place])   #Right-Sided Chest Pain: onset 3 months after shingles outbreak, worsening over the last month. Other symptoms include diaphoresis, SOB, orthopnea, PND, edema, worse with activity. Pain is similar to what she experienced during shingles outbreak. Does note occasional radiation down left arm. CP better with rest. HEART score 3. Troponin elevated but stable at 0.08>0.09. EKG did not show ST changes but noted T-wave inversions consistent with previous EKGs. No previous h/o CP or catheterization. BNP mildly elevated to 119.8. Last ECHO 2014 showed LVEF 60-65% with mod-severe LVH. Cardiology following. CP may be related to untreated shingles being that she was not treated for this. Cardiac etiology is possible but no obvious signs of ischemia at this time. Heart failure is likely given her symptoms of orthopnea, PND, SOB, and leg swelling. No formal diagnosis of heart failure in past. Also question GI etiology of pain given nausea, vomiting. - Continue observation for ACS r/o, vital signs per unit - Cardiology following, appreciate recs - Cardiac monitoring - Repeat EKG this morning changed from EKG in ED, with T wave inversions in V4-V6, mildly prolonged PR interval. Septal infarct  still present. - ECHO pending - A1C pending - Trend troponin- have persistently been 0.07-0.09 - Tylenol q4h PRN mild pain, morphine IV q2h PRN severe pain - Lopressor 25 mg twice daily during acute workup. Transition to Metoprolol succinate with daily dosing prior to discharge. - Zofran q8h PRN nausea - Aspirin 325 mg daily. Anticipate transition to 81mg  dosing at discharge. - Protonix daily  #Postherpetic Neuropathy: onset 3 months ago. Completed course of Valacyclovir. Small rash still apparent on right chest wall. Patient continues to experience pain on right breast radiating to scapula. - Gabapentin 300 mg BID today, can increase to TID 10/1  #Nausea with Non-bloody Vomiting: new onset as of 3 weeks ago. Emesis is non-bloody and always after having food. Maintains normal appetite. Emesis usually occurs around 0600 in the morning. Last episode on 9/29. No nausea during admission. Denied nausea/vomiting overnight - Zofran q8h PRN nausea - Protonix daily  #Essential Hypertension: Home medication includes Lisinopril-HCTZ 20-25 mg daily. Has been normotensive since admission - Continue Lopressor 25 mg daily. - Transition to Metoprolol succinate with daily dosing prior to discharge.  #Hyperlipidemia: last lipid panel 2014 showed hypercholesterolemia 232 and elevated LDL 160. - Repeat Lipid panel Total cholesterol decreased to 220, LDL decreased to 129 - continue Atorvastatin 40mg   #Depression: no depressive symptoms at present. Not currently on medication.   # Prediabetes: A1C 6.1 in 01/2013. Not currently on any medication. - Recheck A1C pending  # Illiterate: Does not read Spanish or English - Typed out instructions at discharge not likely to be helpful. Recommend discussing instructions orally multiple times at discharge.  - Will consult care management to assist patient   FEN/GI: Heart healthy diet Prophylaxis: Heparin  Disposition:  continue observation for acs  r/o  Subjective: Patient is feeling well today, much better than how she felt at home. She denies chest pain on the left side but reports it on the right side near where she had shingles. No shortness of breath, no nausea or vomiting.   Objective: Temp:  [97.8 F (36.6 C)-97.9 F (36.6 C)] 97.9 F (36.6 C) (09/30 0554) Pulse Rate:  [66-85] 71 (09/30 0554) Resp:  [13-24] 18 (09/30 0554) BP: (114-176)/(48-109) 115/70 (09/30 0554) SpO2:  [91 %-100 %] 99 % (09/30 0554) Weight:  [227 lb 9.6 oz (103.2 kg)-230 lb (104.3 kg)] 227 lb 9.6 oz (103.2 kg) (09/29 2023) Physical Exam: General: obese female sitting up on edge of bed in no acute distress Cardiovascular: distant heart sounds, RRR no murmurs rubs or gallops Respiratory: CTAB with no increased work of breathing Abdomen: obese abdomen, soft, non-tender, non-distended, +BS Skin: warm, dry, no rashes noted over chest wall/shoulders Extremities: normal ROM, no edema or cyanosis  Laboratory:  Recent Labs Lab 08/31/16 1031 08/31/16 2121  WBC 9.5 8.8  HGB 12.8 11.9*  HCT 38.2 36.7  PLT 259 246    Recent Labs Lab 08/31/16 1031 08/31/16 2121  NA 138  --   K 3.8  --   CL 106  --   CO2 24  --   BUN 21*  --   CREATININE 0.89 0.81  CALCIUM 9.4  --   PROT 6.7  --   BILITOT 1.0  --   ALKPHOS 58  --   ALT 28  --   AST 24  --   GLUCOSE 101*  --    TSH 2.467 Total Cholesterol 220, Triglycerides 226, HDL 46, LDL 129  Imaging/Diagnostic Tests: Dg Chest 2 View  Result Date: 08/31/2016 CLINICAL DATA:  Right upper chest pain today. EXAM: CHEST  2 VIEW COMPARISON:  01/31/2013 FINDINGS: The heart is borderline enlarged but stable. Mild tortuosity of the thoracic aorta with minimal calcification. Low lung volumes with vascular crowding and streaky atelectasis. Mild chronic bronchitic changes appears stable. The bony thorax is intact. IMPRESSION: Mild stable cardiac enlargement and tortuosity and calcification of the thoracic aorta. Low  lung volumes with vascular crowding and streaky atelectasis. Stable underlying bronchitic changes. Electronically Signed   By: Rudie MeyerP.  Gallerani M.D.   On: 08/31/2016 11:05     Tillman Sersngela C Micha Dosanjh, DO 09/01/2016, 8:46 AM PGY-1, [facility] FPTS Intern pager: [phone], text pages welcome

## 2016-09-01 NOTE — Progress Notes (Signed)
Patient Name: Felicia Jenkins Date of Encounter: 09/01/2016  Hospital Problem List     Active Problems:   Chest pain   Elevated troponin   Post herpetic neuralgia    Patient Profile     Patient with prior cardiac history.  Presents with atypical chest pain and dyspnea but with mildly elevated troponin and BNP.  Non specific findings on EKG.   Subjective   She says that she is breathing much better today.  No acute SOB.  No pain.    Inpatient Medications    . aspirin EC  325 mg Oral Daily  . atorvastatin  40 mg Oral q1800  . gabapentin  300 mg Oral BID  . heparin  5,000 Units Subcutaneous Q8H  . metoprolol tartrate  25 mg Oral BID  . pantoprazole  40 mg Oral Daily    Vital Signs    Vitals:   08/31/16 1800 08/31/16 1945 08/31/16 2023 09/01/16 0554  BP: 122/88 (!) 149/48 131/62 115/70  Pulse: 79 85 71 71  Resp: 24 16 18 18   Temp:   97.8 F (36.6 C) 97.9 F (36.6 C)  TempSrc:   Oral Oral  SpO2: 95% 99% 97% 99%  Weight:   227 lb 9.6 oz (103.2 kg)   Height:   5' (1.524 m)     Intake/Output Summary (Last 24 hours) at 09/01/16 1208 Last data filed at 09/01/16 0913  Gross per 24 hour  Intake              360 ml  Output                0 ml  Net              360 ml   Filed Weights   08/31/16 0934 08/31/16 2023  Weight: 230 lb (104.3 kg) 227 lb 9.6 oz (103.2 kg)    Physical Exam    GEN: Well nourished, well developed, in  no acute distress.  Neck: Supple, no JVD, carotid bruits, or masses. Cardiac: RRR, no rubs, or gallops. No clubbing, cyanosis, no edema.  Radials/DP/PT 2+ and equal bilaterally.  Respiratory:  Respirations  regular and unlabored, clear to auscultation bilaterally. GI: Soft, nontender, nondistended, BS + x 4. Neuro:  Strength and sensation are intact.   Labs    CBC  Recent Labs  08/31/16 1031 08/31/16 2121  WBC 9.5 8.8  NEUTROABS 6.6  --   HGB 12.8 11.9*  HCT 38.2 36.7  MCV 88.6 89.1  PLT 259 246   Basic Metabolic  Panel  Recent Labs  21/30/8609/29/17 1031 08/31/16 2121  NA 138  --   K 3.8  --   CL 106  --   CO2 24  --   GLUCOSE 101*  --   BUN 21*  --   CREATININE 0.89 0.81  CALCIUM 9.4  --    Liver Function Tests  Recent Labs  08/31/16 1031  AST 24  ALT 28  ALKPHOS 58  BILITOT 1.0  PROT 6.7  ALBUMIN 3.7   No results for input(s): LIPASE, AMYLASE in the last 72 hours. Cardiac Enzymes  Recent Labs  08/31/16 2121 09/01/16 0024 09/01/16 0244  TROPONINI 0.08* 0.07* 0.08*   BNP Invalid input(s): POCBNP D-Dimer No results for input(s): DDIMER in the last 72 hours. Hemoglobin A1C No results for input(s): HGBA1C in the last 72 hours. Fasting Lipid Panel  Recent Labs  09/01/16 0244  CHOL 220*  HDL 46  LDLCALC  129*  TRIG 226*  CHOLHDL 4.8   Thyroid Function Tests  Recent Labs  08/31/16 1034  TSH 2.467    Telemetry    NSR  ECG    EKG with deep Twave in V3 - V4 not present on EKG yesterday.   Radiology    Dg Chest 2 View  Result Date: 08/31/2016 CLINICAL DATA:  Right upper chest pain today. EXAM: CHEST  2 VIEW COMPARISON:  01/31/2013 FINDINGS: The heart is borderline enlarged but stable. Mild tortuosity of the thoracic aorta with minimal calcification. Low lung volumes with vascular crowding and streaky atelectasis. Mild chronic bronchitic changes appears stable. The bony thorax is intact. IMPRESSION: Mild stable cardiac enlargement and tortuosity and calcification of the thoracic aorta. Low lung volumes with vascular crowding and streaky atelectasis. Stable underlying bronchitic changes. Electronically Signed   By: Rudie Meyer M.D.   On: 08/31/2016 11:05    Assessment & Plan    CHEST PAIN:  Troponin remains elevated but trend is flat.  This is non diagnostic for an acute coronary process.   Repeat EKG is markedly abnormal compared with previous.  Lateral T wave inversion is now more pronounced.    I will await results of the echo.  She will likely need another study  (perfusion vs cath.)  DYSPNEA:  See above.  DYSLIPIDEMIA:  On statin.   Signed, Rollene Rotunda, MD  09/01/2016, 12:08 PM

## 2016-09-02 ENCOUNTER — Observation Stay (HOSPITAL_BASED_OUTPATIENT_CLINIC_OR_DEPARTMENT_OTHER): Payer: Self-pay

## 2016-09-02 DIAGNOSIS — R079 Chest pain, unspecified: Secondary | ICD-10-CM

## 2016-09-02 DIAGNOSIS — R0609 Other forms of dyspnea: Secondary | ICD-10-CM

## 2016-09-02 DIAGNOSIS — R748 Abnormal levels of other serum enzymes: Secondary | ICD-10-CM

## 2016-09-02 LAB — ECHOCARDIOGRAM COMPLETE
Height: 60 in
WEIGHTICAEL: 3641.6 [oz_av]

## 2016-09-02 LAB — CBC
HCT: 38.7 % (ref 36.0–46.0)
Hemoglobin: 13 g/dL (ref 12.0–15.0)
MCH: 29.8 pg (ref 26.0–34.0)
MCHC: 33.6 g/dL (ref 30.0–36.0)
MCV: 88.8 fL (ref 78.0–100.0)
PLATELETS: 188 10*3/uL (ref 150–400)
RBC: 4.36 MIL/uL (ref 3.87–5.11)
RDW: 13.4 % (ref 11.5–15.5)
WBC: 9.2 10*3/uL (ref 4.0–10.5)

## 2016-09-02 LAB — HEMOGLOBIN A1C
Hgb A1c MFr Bld: 5.9 % — ABNORMAL HIGH (ref 4.8–5.6)
MEAN PLASMA GLUCOSE: 123 mg/dL

## 2016-09-02 MED ORDER — GABAPENTIN 300 MG PO CAPS
300.0000 mg | ORAL_CAPSULE | Freq: Two times a day (BID) | ORAL | 1 refills | Status: DC
Start: 2016-09-02 — End: 2016-10-03

## 2016-09-02 MED ORDER — ASPIRIN 81 MG PO TBEC: 81.0000 mg | DELAYED_RELEASE_TABLET | Freq: Every day | ORAL | 5 refills | Status: AC

## 2016-09-02 MED ORDER — GABAPENTIN 300 MG PO CAPS: 300.0000 mg | ORAL_CAPSULE | Freq: Two times a day (BID) | ORAL | 1 refills | Status: DC

## 2016-09-02 MED ORDER — ACETAMINOPHEN 325 MG PO TABS: 650.0000 mg | ORAL_TABLET | Freq: Four times a day (QID) | ORAL | 3 refills | Status: DC | PRN

## 2016-09-02 MED ORDER — ATORVASTATIN CALCIUM 40 MG PO TABS
40.0000 mg | ORAL_TABLET | Freq: Every day | ORAL | 1 refills | Status: DC
Start: 2016-09-02 — End: 2016-09-02

## 2016-09-02 MED ORDER — ATORVASTATIN CALCIUM 40 MG PO TABS: 40.0000 mg | ORAL_TABLET | Freq: Every day | ORAL | 1 refills | Status: DC

## 2016-09-02 MED ORDER — ASPIRIN 81 MG PO TBEC: 81.0000 mg | DELAYED_RELEASE_TABLET | Freq: Every day | ORAL | 5 refills | Status: DC

## 2016-09-02 MED ORDER — METOPROLOL SUCCINATE ER 25 MG PO TB24: 25.0000 mg | ORAL_TABLET | Freq: Every day | ORAL | 3 refills | Status: AC

## 2016-09-02 MED ORDER — PERFLUTREN LIPID MICROSPHERE
1.0000 mL | INTRAVENOUS | Status: AC | PRN
  Administered 2016-09-02: 2 mL via INTRAVENOUS
  Filled 2016-09-02: qty 10

## 2016-09-02 NOTE — Discharge Summary (Signed)
Family Medicine Teaching [facility] Discharge Summary  Patient name: Felicia Jenkins Medical record number: [id number] Date of birth: 04/15/1961 Age: 55 y.o. Gender: female Date of Admission: 08/31/2016  Date of Discharge: 09/02/16 Admitting Physician: Doreene ElandKehinde T Eniola, MD  Primary Care Provider: No PCP Per Patient Consultants: cardiology  Indication for Hospitalization: chest pain  Discharge Diagnoses/Problem List:  Patient Active Problem List   Diagnosis Date Noted  . DOE (dyspnea on exertion)   . Elevated troponin 08/31/2016  . Post herpetic neuralgia   . Benign essential HTN 05/07/2016  . Depression 03/29/2013  . HLD (hyperlipidemia) 02/02/2013  . HTN (hypertension) 01/29/2013  . Chest pain 01/29/2013    Disposition: Home  Discharge Condition: stable  Discharge Exam: see previous progress note  Brief Hospital Course:  Felicia GraceValvina Gutierrez-Renteriais a 55 y.o.femalewho presented with with fatigue and right breast pain of 1 month duration as well as dyspnea, nausea, vomiting, and abdominal pain. PMH is significant for obesity, HTN, HLD, depression, and shingles (3 months ago involving right breast).  Patient was admitted for ACS rule out. Initial troponins were elevated EKG was noted to be abnormal compared to previous EKGs w/ more pronounced lateral t wave inversions although stable septal infarct. Patient was placed on continuous cardiac monitoring.Troponins were cycled and did not exponetially rise and remained flat. Echocardiogram was remarkable for severe LVH and G1DD. BNP mildly elevated to 119.8. Cardiology was consulted and noted that this was atypical chest pain and not likely an acute cardiac event but she would benefit [facility]. Chest pain was likely post herpetic neuropathy as it resolved with Gabapentin. GI symptoms resolved on admission with the administration of Zofran.    Issues for Follow Up:  1. Atypical chest pain; will  follow up with cardiology and obtain Lexiscan Myoview 2. Post herpetic neuralgia causing chest pain: Continue Gabapentin 300 mg BID 3. Started on Metoprolol succinate 25 mg daily, ASA 81 mg daily, and Lipitor 40 mg day daily prior to discharge, will need to monitor patient on these medications.  4. Lisinopril-HCTZ home medication was not resumed at discharge as patient was normotensive with just Metoprolol. Will need to evaluate blood pressure  Significant Procedures: none  Significant Labs and Imaging:   Recent Labs Lab 08/31/16 1031 08/31/16 2121 09/02/16 0135  WBC 9.5 8.8 9.2  HGB 12.8 11.9* 13.0  HCT 38.2 36.7 38.7  PLT 259 246 188    Recent Labs Lab 08/31/16 1031 08/31/16 2121  NA 138  --   K 3.8  --   CL 106  --   CO2 24  --   GLUCOSE 101*  --   BUN 21*  --   CREATININE 0.89 0.81  CALCIUM 9.4  --   ALKPHOS 58  --   AST 24  --   ALT 28  --   ALBUMIN 3.7  --     Lipid Panel     Component Value Date/Time   CHOL 220 (H) 09/01/2016 0244   TRIG 226 (H) 09/01/2016 0244   HDL 46 09/01/2016 0244   CHOLHDL 4.8 09/01/2016 0244   VLDL 45 (H) 09/01/2016 0244   LDLCALC 129 (H) 09/01/2016 0244   Troponin: 0.08>0.09>0.08   Dg Chest 2 View  Result Date: 08/31/2016 CLINICAL DATA:  Right upper chest pain today. EXAM: CHEST  2 VIEW COMPARISON:  01/31/2013 FINDINGS: The heart is borderline enlarged but stable. Mild tortuosity of the thoracic aorta with minimal calcification. Low lung volumes with vascular crowding and streaky  atelectasis. Mild chronic bronchitic changes appears stable. The bony thorax is intact. IMPRESSION: Mild stable cardiac enlargement and tortuosity and calcification of the thoracic aorta. Low lung volumes with vascular crowding and streaky atelectasis. Stable underlying bronchitic changes. Electronically Signed   By: Rudie Meyer M.D.   On: 08/31/2016 11:05   Echocardiogram: Left ventricle: The cavity size was normal. Wall thickness was increased in a  pattern of severe LVH. There was focal basal hypertrophy. Systolic function was vigorous. The estimated ejection fraction was in the range of 65% to 70%. Wall motion was normal; there were no regional wall motion abnormalities. Doppler parameters are consistent with abnormal left ventricular relaxation (grade 1 diastolic dysfunction). Basal septum measures 1.8 cm. - Aortic valve: Valve area (VTI): 2.6 cm^2. Valve area (Vmax): 2.39 cm^2. Valve area (Vmean): 2.39 cm^2. - Atrial septum: A patent foramen ovale cannot be excluded. - Technically difficult study. Echocontrast used to enhance visualization.   Results/Tests Pending at Time of Discharge: none  Discharge Medications:    Medication List    STOP taking these medications   ibuprofen 200 MG tablet Commonly known as:  ADVIL,MOTRIN   lisinopril-hydrochlorothiazide 20-25 MG tablet Commonly known as:  PRINZIDE,ZESTORETIC   lovastatin 20 MG tablet Commonly known as:  MEVACOR     TAKE these medications   acetaminophen 325 MG tablet Commonly known as:  TYLENOL Take 2 tablets (650 mg total) by mouth every 6 (six) hours as needed for mild pain.   aspirin 81 MG EC tablet Take 1 tablet (81 mg total) by mouth daily.   atorvastatin 40 MG tablet Commonly known as:  LIPITOR Take 1 tablet (40 mg total) by mouth daily at 6 PM.   citalopram 20 MG tablet Commonly known as:  CELEXA Take 1 tablet (20 mg total) by mouth daily.   gabapentin 300 MG capsule Commonly known as:  NEURONTIN Take 1 capsule (300 mg total) by mouth 2 (two) times daily.   metoprolol succinate 25 MG 24 hr tablet Commonly known as:  TOPROL XL Take 1 tablet (25 mg total) by mouth daily.       Discharge Instructions: Please refer to Patient Instructions section of EMR for full details.  Patient was counseled important signs and symptoms that should prompt return to medical care, changes in medications, dietary instructions, activity  restrictions, and follow up appointments.   Follow-Up Appointments: Follow-up Information    [facility] .   Why:  Office will call you for follow up appointment,  Contact information: [address] [place] [address] [phone]          Beaulah Dinning, MD 09/05/2016, 9:24 PM PGY-2, [facility]

## 2016-09-02 NOTE — Progress Notes (Signed)
  Echocardiogram 2D Echocardiogram with Definity has been performed.  Leta JunglingCooper, Shayma Pfefferle M 09/02/2016, 10:13 AM

## 2016-09-02 NOTE — Progress Notes (Signed)
Patient Name: Felicia LynchValvina Jenkins Date of Encounter: 09/02/2016  Hospital Problem List     Active Problems:   Chest pain   Elevated troponin   Post herpetic neuralgia    Patient Profile     Patient with prior cardiac history.  Presents with atypical chest pain and dyspnea but with mildly elevated troponin and BNP.  Non specific findings on EKG.   Subjective    No SOB.  No pain.    Inpatient Medications    . aspirin EC  325 mg Oral Daily  . atorvastatin  40 mg Oral q1800  . gabapentin  300 mg Oral BID  . heparin  5,000 Units Subcutaneous Q8H  . metoprolol tartrate  25 mg Oral BID  . pantoprazole  40 mg Oral Daily    Vital Signs    Vitals:   09/01/16 1300 09/01/16 1959 09/02/16 0658 09/02/16 1303  BP: (!) 104/54 112/70 130/75 109/67  Pulse: 82 83 (!) 58 79  Resp: 18 16 14 18   Temp: 98.5 F (36.9 C) 98.9 F (37.2 C) 98.4 F (36.9 C) 97.2 F (36.2 C)  TempSrc: Oral Oral Oral Oral  SpO2: 97% 97% 100% 99%  Weight:      Height:        Intake/Output Summary (Last 24 hours) at 09/02/16 1629 Last data filed at 09/02/16 1304  Gross per 24 hour  Intake             1080 ml  Output                0 ml  Net             1080 ml   Filed Weights   08/31/16 0934 08/31/16 2023  Weight: 230 lb (104.3 kg) 227 lb 9.6 oz (103.2 kg)    Physical Exam    GEN: Well nourished, well developed, in  no acute distress.   Labs    CBC  Recent Labs  08/31/16 1031 08/31/16 2121 09/02/16 0135  WBC 9.5 8.8 9.2  NEUTROABS 6.6  --   --   HGB 12.8 11.9* 13.0  HCT 38.2 36.7 38.7  MCV 88.6 89.1 88.8  PLT 259 246 188   Basic Metabolic Panel  Recent Labs  08/31/16 1031 08/31/16 2121  NA 138  --   K 3.8  --   CL 106  --   CO2 24  --   GLUCOSE 101*  --   BUN 21*  --   CREATININE 0.89 0.81  CALCIUM 9.4  --    Liver Function Tests  Recent Labs  08/31/16 1031  AST 24  ALT 28  ALKPHOS 58  BILITOT 1.0  PROT 6.7  ALBUMIN 3.7   No results for input(s):  LIPASE, AMYLASE in the last 72 hours. Cardiac Enzymes  Recent Labs  08/31/16 2121 09/01/16 0024 09/01/16 0244  TROPONINI 0.08* 0.07* 0.08*   BNP Invalid input(s): POCBNP D-Dimer No results for input(s): DDIMER in the last 72 hours. Hemoglobin A1C  Recent Labs  08/31/16 2121  HGBA1C 5.9*   Fasting Lipid Panel  Recent Labs  09/01/16 0244  CHOL 220*  HDL 46  LDLCALC 129*  TRIG 226*  CHOLHDL 4.8   Thyroid Function Tests  Recent Labs  08/31/16 1034  TSH 2.467    Telemetry    NSR  Echo    Left ventricle: The cavity size was normal. Wall thickness was   increased in a pattern of severe LVH. There  was focal basal   hypertrophy. Systolic function was vigorous. The estimated   ejection fraction was in the range of 65% to 70%. Wall motion was   normal; there were no regional wall motion abnormalities. Doppler   parameters are consistent with abnormal left ventricular   relaxation (grade 1 diastolic dysfunction). Basal septum measures   1.8 cm. - Aortic valve: Valve area (VTI): 2.6 cm^2. Valve area (Vmax): 2.39   cm^2. Valve area (Vmean): 2.39 cm^2. - Atrial septum: A patent foramen ovale cannot be excluded. - Technically difficult study. Echocontrast used to enhance   visualization.  Radiology    Dg Chest 2 View  Result Date: 08/31/2016 CLINICAL DATA:  Right upper chest pain today. EXAM: CHEST  2 VIEW COMPARISON:  01/31/2013 FINDINGS: The heart is borderline enlarged but stable. Mild tortuosity of the thoracic aorta with minimal calcification. Low lung volumes with vascular crowding and streaky atelectasis. Mild chronic bronchitic changes appears stable. The bony thorax is intact. IMPRESSION: Mild stable cardiac enlargement and tortuosity and calcification of the thoracic aorta. Low lung volumes with vascular crowding and streaky atelectasis. Stable underlying bronchitic changes. Electronically Signed   By: Rudie Meyer M.D.   On: 08/31/2016 11:05     Assessment & Plan    CHEST PAIN:  Troponin remains elevated but trend is flat.  EKG was abnormal.  However, she has severe LVH on echo.  This would explain the EKG changes.  She has had no symptoms consistent with ischemia.  She can get an out patient [employer].  We discussed the results of the test through the interpreter.    DYSPNEA:  See above.  Probably some element of diastolic dysfunction.  She needs to avoid salt.    DYSLIPIDEMIA:  On statin.   Signed, Rollene Rotunda, MD  09/02/2016, 4:29 PM

## 2016-09-02 NOTE — Progress Notes (Signed)
Patient given discharge instructions follow up appointments along with medication list, and prescriptions sent to personal pharmacy.  [facility]  Discharge instructions completed with [employer] Interpreter on phone. Patient stated understanding [facility] interpreter [facility] And follow up appointment. IV and tele were dcd will discharge home with family. Leverne Tessler, Randall AnKristin Jessup RN

## 2016-09-02 NOTE — Discharge Instructions (Signed)
You were admitted to the hospital for chest pain. We are glad that your chest pain has resolved. Your cardiologist will call you to schedule further testing in the outpatient setting. Please return to the hospital if you have increased chest pain, trouble breathing, or dizziness.    Angina de pecho (Angina Pectoris) La angina de pecho, a menudo llamada simplemente angina, es una sensacin de [employer] pecho, el cuello o el brazo, causada por la falta de sangre en la capa media y ms gruesa de la pared del corazn (miocardio). Hay cuatro tipos de angina de pecho.  Angina estable. La frecuencia y la duracin de los episodios de angina estable son predecibles. Por lo general, la causa es la actividad fsica, el estrs emocional o la excitacin. La angina estable suele durar unos pocos minutos y a menudo se [place] tomando un medicamento que se coloca debajo de la lengua, denominado nitroglicerina sublingual.  Angina inestable. La angina inestable puede ocurrir aun cuando el cuerpo realiza poco o ningn esfuerzo fsico, e incluso mientras duerme o descansa. Puede aumentar de repente en gravedad o frecuencia. No se puede aliviar con nitroglicerina sublingual y puede durar hasta 30 minutos.  Angina microvascular. Este tipo de angina se produce por un trastorno de los vasos sanguneos diminutos llamados arteriolas. Es ms comn en las mujeres. El dolor puede ser ms intenso y durar ms que otros tipos de angina de pecho.  Angina de Prinzmetal o angina variante. Este tipo de angina de pecho es poco frecuente y por lo general ocurre cuando realiza poco o ningn esfuerzo fsico. Psychiatrist sobre todo en las primeras horas de la [place]. CAUSAS La ateroesclerosis causa la angina. Es la acumulacin de grasa y Oncologist (placa) en el interior de las arterias. Con el tiempo, la Financial controller u obstruir la arteria y esto reducir el flujo sanguneo al corazn. La placa tambin puede debilitarse y  desprenderse en una arteria coronaria para formar un cogulo y causar una obstruccin repentina. FACTORES DE RIESGO Los factores de riesgo comunes a hombres y mujeres incluyen lo siguiente:  Niveles elevados de colesterol.  Presin arterial elevada (hipertensin).  Consumo de tabaco.  Diabetes.  Antecedentes familiares de angina.  Obesidad.  La falta de actividad fsica.  Una dieta con alto contenido de grasas saturadas. Las mujeres presentan un riesgo ms alto de tener angina si:  Tienen de 55 aos.  Estn en la posmenopausia. SNTOMAS Muchas personas no presentan sntomas durante las primeras etapas de la angina. A medida que la afeccin progresa, los sntomas comunes a hombres y mujeres pueden incluir lo siguiente:  Journalist, newspaper.  Se describe como un dolor que aplasta o retuerce el pecho, o una opresin, presin, distensin o Wellsite geologist.  El dolor puede durar ms de unos minutos o puede detenerse y Programme researcher, broadcasting/film/video a Warden/ranger.  Dolor en los brazos, el cuello, la mandbula o la espalda.  Acidez estomacal o empacho sin causa aparente.  Falta de aire.  Nuseas.  Sudor fro repentino.  Sensacin repentina de desvanecimiento. Muchas mujeres tienen [employer] pecho y algunos de los otros sntomas. Sin embargo, a menudo Theatre manager (atpicos), como:   Fatiga.  Sensacin inexplicable de nerviosismo o ansiedad.  Debilidad sin causa aparente.  Mareos o [employer]. En ocasiones, las mujeres pueden tener angina sin presentar sntomas. DIAGNSTICO  [employer] estudios para diagnosticar la angina se incluyen los siguientes:  ECG (electrocardiograma).  Prueba de esfuerzo. Se utiliza para  detectar signos de obstruccin cuando el corazn se somete a ejercicio.  Prueba de Chief Financial Officeresfuerzo farmacolgica. Se utiliza para detectar signos de obstruccin cuando el corazn se somete a Conservation officer, historic buildingsesfuerzo mediante el uso de un medicamento.  Anlisis de  Rollasangre.  Angiografa coronaria. En este procedimiento se detecta si hay obstruccin en las arterias coronarias. TRATAMIENTO  El tratamiento de la angina puede incluir lo siguiente:  Adquirir hbitos saludables para reducir o Chief Operating Officercontrolar los factores de [place] Parkriesgo.  Medicamentos.  Colocacin de stent de arteria coronaria.Un stent ayuda a mantener abierta una arteria.  Angioplastia coronaria. En este procedimiento se ensancha una arteria estrechada u obstruida.  Ciruga de revascularizacin arterial coronaria. Esta permitir que la sangre pase la obstruccin (revascularizacin) para llegar al corazn. INSTRUCCIONES PARA EL CUIDADO EN EL HOGAR   Tome los medicamentos solamente como se lo haya indicado el mdico.  No tome los siguientes medicamentos a menos que el mdico lo autorice:  Antiinflamatorios no esteroides (AINE), como ibuprofeno, naproxeno o celecoxib.  Suplementos vitamnicos que contienen vitamina A, vitamina E o ambas.  Tratamientos de reposicin hormonal que contienen estrgeno con o sin progestina.  Controle otros problemas de Torringtonsalud, como hipertensin y diabetes, como se lo haya indicado su mdico.  Siga una dieta cardiosaludable. El nutricionista puede darle instrucciones sobre opciones de alimentos saludables y los cambios correspondientes.  Use mtodos de coccin saludables, como asar, Software engineergrillar, hervir, hornear, cocer al vapor o Actorsaltear. Hable con un nutricionista para aprender ms acerca de los mtodos de coccin saludables.  Siga un programa de actividad fsica aprobado por el mdico.  Mantenga un peso saludable. Baje de peso segn se lo indique el mdico.  Planifique perodos de descanso cuando se sienta fatigado.  Aprenda a Dealermanejar el estrs.  No consuma ningn producto que contenga tabaco, lo que incluye cigarrillos, tabaco de Theatre managermascar o Administrator, Civil Servicecigarrillos electrnicos. Si necesita ayuda para dejar de fumar, consulte al mdico.  Si bebe alcohol y su mdico lo aprueba,  limite la ingesta a no ms de 1 medida por da. Una medida equivale a 12onzas de cerveza, 5onzas de vino o 1onzas de bebidas alcohlicas de alta graduacin.  No consuma drogas.  Concurra a todas las visitas de control como se lo haya indicado el mdico. Esto es importante. SOLICITE ATENCIN MDICA DE Engelhard CorporationNMEDIATO SI:   Siente dolor en el pecho, el cuello, el brazo, la [place] Mallmandbula, el estmago o la espalda que dure ms de unos pocos minutos, el dolor es recurrente o no se Chief Executive Officeralivia con nitroglicerina sublingual.  Tiene sudoraciones intensas sin causa.  Tiene estos sntomas sin causa aparente:  Acidez estomacal o empacho.  Falta de aire o dificultades respiratorias.  Nuseas o vmitos.  Fatiga.  Nerviosismo o ansiedad.  Debilidad.  Diarrea.  Mareos o sensacin de desvanecimiento repentinos.  Se desmaya. Estos sntomas pueden representar un problema grave que constituye Radio broadcast assistantuna emergencia. No espere hasta que los sntomas desaparezcan. Solicite atencin mdica de inmediato. Comunquese con el servicio de emergencias de su localidad (911 en los [place]). No conduzca por sus propios medios OfficeMax Incorporatedhasta el hospital.   Esta informacin no tiene Theme park managercomo fin reemplazar el consejo del mdico. Asegrese de hacerle al mdico cualquier pregunta que tenga.   Document Released: 11/19/2005 Document Revised: 12/10/2014 Elsevier Interactive Patient Education [employer]2016 Elsevier Inc.

## 2016-09-02 NOTE — Progress Notes (Signed)
Family Medicine Teaching Service Daily Progress Note Intern Pager: [phone]  Patient name: Felicia Jenkins Medical record number: [id number] Date of birth: Jul 25, 1961 Age: 55 y.o. Gender: female  Primary Care Provider: No PCP Per Patient Consultants: cardiology Code Status: full  Pt Overview and Major Events to Date:  9/29- Admitted to observation for ACS r/o  Assessment and Plan: Felicia Jenkins is a 55 y.o. female presenting with fatigue and right breast pain of 1 month duration. PMH is significant for obesity, HTN, HLD, depression, and shingles (3 months ago involving right breast). * NOTE there are two charts in Epic. In process of merging records. Both charts reviewed. Ginny Forth, [place])   Right-Sided Chest Pain: Resolved. Differentials include post herpetic neurolgia (although happened 3 months ago) vs ACS process (Troponin elevated but stable at 0.08>0.09>0.08 and EKG abnormal compared to previous EKGs w/ more pronounced lateral t wave inversions although stable septal infarct) vs CHF (BNP mildly elevated to 119.8 and ECHO in 2014 showed LVEF 60-65% with mod-severe LVH) vs GI etiology of pain given nausea, vomiting.  - Continue observation for cardiac work up  - Cardiology following, appreciate recs - Continuous cardiac monitoring - ECHO pending - Tylenol q4h PRN mild pain, morphine IV q2h PRN severe pain - Lopressor 25 mg twice daily during acute workup. Transition to Metoprolol succinate with daily dosing prior to discharge. - Zofran q8h PRN nausea - Aspirin 325 mg daily. Anticipate transition to 81mg  dosing at discharge. - Protonix daily  Postherpetic Neuropathy: onset 3 months ago. Completed course of Valacyclovir. Small rash still apparent on right chest wall. Patient continues to experience pain on right breast radiating to scapula. - Gabapentin 300 mg BID today, providing relief. Will keep at this dose  Nausea with Non-bloody Vomiting: new onset  as of 3 weeks ago. Emesis is non-bloody and always after having food. Maintains normal appetite. Emesis usually occurs around 0600 in the morning. Last episode on 9/29. No nausea during admission. Denied nausea/vomiting overnight - Zofran q8h PRN nausea - Protonix daily  Essential Hypertension: Home medication includes Lisinopril-HCTZ 20-25 mg daily. Has been normotensive since admission - Continue Lopressor 25 mg twice daily. Transition to Metoprolol succinate with daily dosing prior to discharge.  Hyperlipidemia: last lipid panel 2014 showed hypercholesterolemia 232 and elevated LDL 160. - Repeat Lipid panel Total cholesterol decreased to 220, LDL decreased to 129 - continue Atorvastatin 40mg   Depression: no depressive symptoms at present. Not currently on medication.   Prediabetes: A1C 6.1 in 01/2013. Not currently on any medication. - Recheck A1C pending  Illiterate: Does not read Spanish or [place] - Typed out instructions at discharge not likely to be helpful. Recommend discussing instructions orally multiple times at discharge.  - Will consult care management to assist patient   FEN/GI: Heart healthy diet Prophylaxis: Heparin  Disposition: continue observation for acs r/o  Subjective:  Vital signs stable overnight and this morning. Use phone spanish interpreter with patient this morning although her English is good. Patient has no complaints today, no pain at all. Denies chest pain, shortness, of breath. Has been urinating and stooling ok.  She is anxious to have her echocardiogram today because she wants to know if there is anything wrong with her heart. She is also wanting to go home.  Objective: Temp:  [98.4 F (36.9 C)-98.9 F (37.2 C)] 98.4 F (36.9 C) (10/01 0658) Pulse Rate:  [58-83] 58 (10/01 0658) Resp:  [14-18] 14 (10/01 0658) BP: (104-130)/(54-75) 130/75 (10/01 0658) SpO2:  [97 %-100 %]  100 % (10/01 47820658) Physical Exam: General: Pleasant obese female laying  in bed about to get her echocardiogram, in no acute distress Cardiovascular: distant heart sounds, RRR no murmurs rubs or gallops, no edema Respiratory: CTAB with no increased work of breathing Abdomen: obese abdomen, soft, non-tender, non-distended, +BS Skin: warm, dry, no rashes noted over chest wall/shoulders Extremities: normal ROM, no edema or cyanosis  Laboratory:  Recent Labs Lab 08/31/16 1031 08/31/16 2121 09/02/16 0135  WBC 9.5 8.8 9.2  HGB 12.8 11.9* 13.0  HCT 38.2 36.7 38.7  PLT 259 246 188    Recent Labs Lab 08/31/16 1031 08/31/16 2121  NA 138  --   K 3.8  --   CL 106  --   CO2 24  --   BUN 21*  --   CREATININE 0.89 0.81  CALCIUM 9.4  --   PROT 6.7  --   BILITOT 1.0  --   ALKPHOS 58  --   ALT 28  --   AST 24  --   GLUCOSE 101*  --    TSH 2.467 Total Cholesterol 220, Triglycerides 226, HDL 46, LDL 129  Imaging/Diagnostic Tests: Dg Chest 2 View  Result Date: 08/31/2016 CLINICAL DATA:  Right upper chest pain today. EXAM: CHEST  2 VIEW COMPARISON:  01/31/2013 FINDINGS: The heart is borderline enlarged but stable. Mild tortuosity of the thoracic aorta with minimal calcification. Low lung volumes with vascular crowding and streaky atelectasis. Mild chronic bronchitic changes appears stable. The bony thorax is intact. IMPRESSION: Mild stable cardiac enlargement and tortuosity and calcification of the thoracic aorta. Low lung volumes with vascular crowding and streaky atelectasis. Stable underlying bronchitic changes. Electronically Signed   By: Rudie MeyerP.  Gallerani M.D.   On: 08/31/2016 11:05     Beaulah Dinninghristina M Valine Drozdowski, MD 09/02/2016, 7:29 AM PGY-2, [facility] FPTS Intern pager: [phone], text pages welcome

## 2016-09-02 NOTE — Progress Notes (Signed)
Spoke with Fr. Hochrein about patients exchocardiogram, ok for DC

## 2016-09-03 ENCOUNTER — Encounter (HOSPITAL_COMMUNITY): Payer: Self-pay | Admitting: Emergency Medicine

## 2016-09-03 ENCOUNTER — Other Ambulatory Visit: Payer: Self-pay | Admitting: Cardiology

## 2016-09-03 DIAGNOSIS — R0609 Other forms of dyspnea: Secondary | ICD-10-CM

## 2016-09-03 DIAGNOSIS — R0602 Shortness of breath: Secondary | ICD-10-CM

## 2016-09-03 LAB — HEMOGLOBIN A1C
HEMOGLOBIN A1C: 6.1 % — AB (ref 4.8–5.6)
Mean Plasma Glucose: 128 mg/dL

## 2016-09-05 NOTE — Discharge Summary (Signed)
Will cosign resident's note once D/C summary is completed

## 2016-09-29 ENCOUNTER — Other Ambulatory Visit: Payer: Self-pay | Admitting: Family Medicine

## 2016-10-03 ENCOUNTER — Emergency Department (HOSPITAL_COMMUNITY)
Admission: EM | Admit: 2016-10-03 | Discharge: 2016-10-03 | Disposition: A | Discharge status: Home / Self Care | Payer: Self-pay | Attending: Emergency Medicine | Admitting: Emergency Medicine

## 2016-10-03 ENCOUNTER — Other Ambulatory Visit: Payer: Self-pay | Admitting: Family Medicine

## 2016-10-03 ENCOUNTER — Encounter (HOSPITAL_COMMUNITY): Payer: Self-pay

## 2016-10-03 DIAGNOSIS — Z7982 Long term (current) use of aspirin: Secondary | ICD-10-CM | POA: Insufficient documentation

## 2016-10-03 DIAGNOSIS — G629 Polyneuropathy, unspecified: Secondary | ICD-10-CM | POA: Insufficient documentation

## 2016-10-03 DIAGNOSIS — I10 Essential (primary) hypertension: Secondary | ICD-10-CM | POA: Insufficient documentation

## 2016-10-03 DIAGNOSIS — Z760 Encounter for issue of repeat prescription: Secondary | ICD-10-CM | POA: Insufficient documentation

## 2016-10-03 MED ORDER — GABAPENTIN 300 MG PO CAPS: 300.0000 mg | ORAL_CAPSULE | Freq: Two times a day (BID) | ORAL | 1 refills | Status: AC

## 2016-10-03 MED ORDER — ATORVASTATIN CALCIUM 40 MG PO TABS: 40.0000 mg | ORAL_TABLET | Freq: Every day | ORAL | 1 refills | Status: AC

## 2016-10-03 NOTE — Discharge Planning (Signed)
Provided pt with [facility] orange card applicant in spanish.

## 2016-10-03 NOTE — ED Provider Notes (Signed)
[facility]EM[facility]ENCY DEPT Provider Note   CSN: [id number] Arrival date & time: 10/03/16  0840  By signing my name below, I, Felicia Jenkins, attest that this documentation has been prepared under the direction and in the presence of non-physician practitioner, Felicia LowerVrinda Izzac Rockett, NP. Electronically Signed: Majel HomerPeyton Jenkins, Scribe. 10/03/2016. 9:23 AM.  History   Chief Complaint Chief Complaint  Patient presents with  . shoulder/neck pain-right side   The history is provided by the patient. A language interpreter was used.   HPI Comments: Felicia Jenkins is a 55 y.o. female with PMHx of HTN, HLD and shingles, who presents to the Emergency Department for a refill of her gabapentin and cholesterol medication. Pt reports she is now experiencing right arm and breast pain due to not being able to take this medication. She notes she does not currently have a PCP because "she doesn't have an orange card."   Past Medical History:  Diagnosis Date  . Hypertension   . Hypertension    untreated  . Obesity   . Shingles     Patient Active Problem List   Diagnosis Date Noted  . DOE (dyspnea on exertion)   . Elevated troponin 08/31/2016  . Post herpetic neuralgia   . Benign essential HTN 05/07/2016  . Depression 03/29/2013  . HLD (hyperlipidemia) 02/02/2013  . HTN (hypertension) 01/29/2013  . Chest pain 01/29/2013    Past Surgical History:  Procedure Laterality Date  . ABDOMINAL HYSTERECTOMY    . APPENDECTOMY    . OVARIAN CYST REMOVAL      OB History    No data available       Home Medications    Prior to Admission medications   Medication Sig Start Date End Date Taking? Authorizing Provider  acetaminophen (TYLENOL) 325 MG tablet Take 2 tablets (650 mg total) by mouth every 6 (six) hours as needed for mild pain. 09/02/16   Beaulah Dinninghristina M Gambino, MD  aspirin 81 MG EC tablet Take 1 tablet (81 mg total) by mouth daily. 09/02/16   Beaulah Dinninghristina M Gambino, MD  atorvastatin (LIPITOR) 40 MG tablet  Take 1 tablet (40 mg total) by mouth daily at 6 PM. 09/02/16   Beaulah Dinninghristina M Gambino, MD  augmented betamethasone dipropionate (DIPROLENE) 0.05 % ointment Apply topically 2 (two) times daily. To knee.  Do not apply longer than 2 weeks. Patient not taking: Reported on 07/04/2016 05/04/16   Collie SiadStephanie D English, PA  cetirizine (ZYRTEC) 10 MG tablet Take 1 tablet (10 mg total) by mouth daily. 05/04/16   Collie SiadStephanie D English, PA  citalopram (CELEXA) 20 MG tablet Take 1 tablet (20 mg total) by mouth daily. 03/27/13 03/27/14  Myrlene BrokerElizabeth A Crawford, MD  docusate sodium (COLACE) 100 MG capsule Take 1 capsule (100 mg total) by mouth every 12 (twelve) hours. 06/14/16   Renne CriglerJoshua Geiple, PA-C  gabapentin (NEURONTIN) 300 MG capsule Day 1: 300 mg x 1 Day 2: 300 mg BID Day 3-14 300 mg TID 07/04/16   Pricilla LovelessScott Goldston, MD  gabapentin (NEURONTIN) 300 MG capsule Take 1 capsule (300 mg total) by mouth 2 (two) times daily. 09/02/16   Beaulah Dinninghristina M Gambino, MD  HYDROcodone-acetaminophen (NORCO/VICODIN) 5-325 MG tablet Take 1-2 tablets by mouth every 4 (four) hours as needed for moderate pain. Patient not taking: Reported on 07/04/2016 06/02/16   Sherren MochaEva N Shaw, MD  lisinopril-hydrochlorothiazide (ZESTORETIC) 20-12.5 MG tablet Take 1 tablet by mouth daily. 06/02/16   Sherren MochaEva N Shaw, MD  metoprolol succinate (TOPROL XL) 25 MG 24 hr tablet Take 1  tablet (25 mg total) by mouth daily. 09/02/16   Beaulah Dinninghristina M Gambino, MD  montelukast (SINGULAIR) 10 MG tablet Take 1 tablet (10 mg total) by mouth at bedtime. 05/04/16   Collie SiadStephanie D English, PA  mupirocin ointment (BACTROBAN) 2 % Place 1 application into the nose 2 (two) times daily. Apply to scalp. Patient not taking: Reported on 07/04/2016 05/04/16   Collie SiadStephanie D English, PA  naproxen (NAPROSYN) 500 MG tablet Take 1 tablet (500 mg total) by mouth 2 (two) times daily with a meal. 07/04/16   Pricilla LovelessScott Goldston, MD  oxyCODONE (OXY IR/ROXICODONE) 5 MG immediate release tablet Take 0.5-1 tablets (2.5-5 mg total) by mouth every 6 (six)  hours as needed for severe pain. 06/16/16   Arthor CaptainAbigail Harris, PA-C  oxyCODONE-acetaminophen (PERCOCET/ROXICET) 5-325 MG tablet Take 1-2 tablets by mouth every 6 (six) hours as needed for severe pain. Patient not taking: Reported on 07/04/2016 06/14/16   Renne CriglerJoshua Geiple, PA-C  polyethylene glycol powder (GLYCOLAX/MIRALAX) powder Take 17 g by mouth daily. 06/14/16   Renne CriglerJoshua Geiple, PA-C  Triamcinolone Acetonide 0.025 % LOTN Apply 1 application topically daily. To face.  Avoid eyelids.  No longer than 2 weeks. Patient not taking: Reported on 07/04/2016 05/04/16   Garnetta BuddyStephanie D English, PA    Family History Family History  Problem Relation Age of Onset  . Arthritis Mother   . Leukemia Father     Social History Social History  Substance Use Topics  . Smoking status: Never Smoker  . Smokeless tobacco: Never Used  . Alcohol use No     Allergies   Penicillins and Penicillins   Review of Systems Review of Systems  Constitutional: Negative for fever.  Musculoskeletal: Positive for myalgias (right arm and breast).  All other systems reviewed and are negative.  Physical Exam Updated Vital Signs BP 146/100 (BP Location: Right Arm)   Pulse 78   Temp 98.2 F (36.8 C) (Oral)   Resp 18   Wt 227 lb (103 kg)   SpO2 99%   BMI 44.33 kg/m   Physical Exam  Constitutional: She is oriented to person, place, and time. She appears well-developed and well-nourished.  HENT:  Head: Normocephalic.  Eyes: EOM are normal.  Neck: Normal range of motion.  Pulmonary/Chest: Effort normal.  Abdominal: She exhibits no distension.  Musculoskeletal: Normal range of motion.  Neurological: She is alert and oriented to person, place, and time.  Psychiatric: She has a normal mood and affect.  Nursing note and vitals reviewed.  ED Treatments / Results  Labs (all labs ordered are listed, but only abnormal results are displayed) Labs Reviewed - No data to display  EKG  EKG Interpretation None        Radiology No results found.  Procedures Procedures (including critical care time)  Medications Ordered in ED Medications - No data to display  DIAGNOSTIC STUDIES:  Oxygen Saturation is 99% on RA, normal by my interpretation.    COORDINATION OF CARE:  9:21 AM Discussed treatment plan with pt at bedside and pt agreed to plan.  Initial Impression / Assessment and Plan / ED Course  I have reviewed the triage vital signs and the nursing notes.  Pertinent labs & imaging results that were available during my care of the patient were reviewed by me and considered in my medical decision making (see chart for details).  Clinical Course    Case management involved. Will refill medications today. Put discussed with pt that she needs to have a pcp  I  personally performed the services described in this documentation, which was scribed in my presence. The recorded information has been reviewed and is accurate.   Final Clinical Impressions(s) / ED Diagnoses   Final diagnoses:  None    New Prescriptions New Prescriptions   No medications on file     Felicia Lower, NP 10/03/16 1057    Arby Barrette, MD 10/06/16 1340

## 2016-10-03 NOTE — ED Triage Notes (Addendum)
Patient complains of right shoulder and right sided neck pain x 4 months. Wants refill of her medications. Gabapentin and cholesterol meds. Will need interpretor phone

## 2016-10-31 ENCOUNTER — Encounter (HOSPITAL_COMMUNITY): Payer: Self-pay | Admitting: *Deleted

## 2016-10-31 ENCOUNTER — Emergency Department (HOSPITAL_COMMUNITY)
Admission: EM | Admit: 2016-10-31 | Discharge: 2016-10-31 | Disposition: A | Discharge status: Home / Self Care | Payer: Self-pay | Attending: Emergency Medicine | Admitting: Emergency Medicine

## 2016-10-31 ENCOUNTER — Emergency Department (HOSPITAL_COMMUNITY): Payer: Self-pay

## 2016-10-31 ENCOUNTER — Other Ambulatory Visit: Payer: Self-pay

## 2016-10-31 DIAGNOSIS — Z7982 Long term (current) use of aspirin: Secondary | ICD-10-CM | POA: Insufficient documentation

## 2016-10-31 DIAGNOSIS — Z79899 Other long term (current) drug therapy: Secondary | ICD-10-CM | POA: Insufficient documentation

## 2016-10-31 DIAGNOSIS — Z760 Encounter for issue of repeat prescription: Secondary | ICD-10-CM | POA: Insufficient documentation

## 2016-10-31 DIAGNOSIS — R609 Edema, unspecified: Secondary | ICD-10-CM

## 2016-10-31 DIAGNOSIS — G8929 Other chronic pain: Secondary | ICD-10-CM | POA: Insufficient documentation

## 2016-10-31 DIAGNOSIS — R600 Localized edema: Secondary | ICD-10-CM | POA: Insufficient documentation

## 2016-10-31 DIAGNOSIS — I10 Essential (primary) hypertension: Secondary | ICD-10-CM | POA: Insufficient documentation

## 2016-10-31 LAB — CBC
HCT: 38.3 % (ref 36.0–46.0)
Hemoglobin: 12.8 g/dL (ref 12.0–15.0)
MCH: 28.5 pg (ref 26.0–34.0)
MCHC: 33.4 g/dL (ref 30.0–36.0)
MCV: 85.3 fL (ref 78.0–100.0)
PLATELETS: 215 10*3/uL (ref 150–400)
RBC: 4.49 MIL/uL (ref 3.87–5.11)
RDW: 12.9 % (ref 11.5–15.5)
WBC: 6.8 10*3/uL (ref 4.0–10.5)

## 2016-10-31 LAB — BASIC METABOLIC PANEL
Anion gap: 9 (ref 5–15)
BUN: 15 mg/dL (ref 6–20)
CO2: 24 mmol/L (ref 22–32)
CREATININE: 0.83 mg/dL (ref 0.44–1.00)
Calcium: 8.9 mg/dL (ref 8.9–10.3)
Chloride: 106 mmol/L (ref 101–111)
GFR calc Af Amer: >60 mL/min (ref >60)
Glucose, Bld: 94 mg/dL (ref 65–99)
POTASSIUM: 4.3 mmol/L (ref 3.5–5.1)
SODIUM: 139 mmol/L (ref 135–145)

## 2016-10-31 LAB — I-STAT TROPONIN, ED: Troponin i, poc: 0.01 ng/mL (ref 0.00–0.08)

## 2016-10-31 MED ORDER — ATORVASTATIN CALCIUM 40 MG PO TABS: 40.0000 mg | ORAL_TABLET | Freq: Every day | ORAL | 1 refills | Status: AC

## 2016-10-31 MED ORDER — HYDROCHLOROTHIAZIDE 25 MG PO TABS: 25.0000 mg | ORAL_TABLET | Freq: Every day | ORAL | 0 refills | Status: AC

## 2016-10-31 MED ORDER — METOPROLOL SUCCINATE ER 25 MG PO TB24: 25.0000 mg | ORAL_TABLET | Freq: Every day | ORAL | 1 refills | Status: AC

## 2016-10-31 MED ORDER — GABAPENTIN 300 MG PO CAPS: 300.0000 mg | ORAL_CAPSULE | Freq: Two times a day (BID) | ORAL | 1 refills | Status: AC

## 2016-10-31 NOTE — ED Triage Notes (Signed)
Used interpretor [id number]. Pt reports hx of chicken pox 5 months ago and still has mild rash but moderate pain to right breast. Unable to sleep at night. Also has SOB in the am. No resp distress is noted at triage.

## 2016-10-31 NOTE — ED Notes (Signed)
MD at the bedside  

## 2016-10-31 NOTE — ED Provider Notes (Signed)
[facility] Provider Note   CSN: [id number] Arrival date & time: 10/31/16  0841     History   Chief Complaint Chief Complaint  Patient presents with  . Breast Pain  . Shortness of Breath    HPI Felicia Jenkins is a 55 y.o. female. Patient Spanish-speaking. Interview conducted in [place].  HPI Patient states she has had pain on the right side of her chest for 5 months ever since she had varicella rash. It continues to hurt and is worse with movements. She reports she still feels like she has pain on the inside of her chest. She also reports that she can't stand to have her clothes touching her skin on her chest. The rash only has very small scars residual. No fever but the patient reports that all around the right side of her chest she feels like she gets hot and burns. No cough. She reports that it does help when she takes Neurontin but she has run out now.  Patient reports she has also run out of her heart pills. She indicates a bottle with metoprolol. She also reports that she has run out of the Lipitor that she had previously. She states she took the last pills of them today.  Patient states she continues to have problems with swelling in her legs and her feet. She reports is uncomfortable and is a continuous problem.  Patient states she hasn't been able to get a family doctor because she doesn't have a medical card.   Past Medical History:  Diagnosis Date  . Hypertension   . Hypertension    untreated  . Obesity   . Shingles     Patient Active Problem List   Diagnosis Date Noted  . DOE (dyspnea on exertion)   . Elevated troponin 08/31/2016  . Post herpetic neuralgia   . Benign essential HTN 05/07/2016  . Depression 03/29/2013  . HLD (hyperlipidemia) 02/02/2013  . HTN (hypertension) 01/29/2013  . Chest pain 01/29/2013    Past Surgical History:  Procedure Laterality Date  . ABDOMINAL HYSTERECTOMY    . APPENDECTOMY    . OVARIAN CYST REMOVAL        OB History    No data available       Home Medications    Prior to Admission medications   Medication Sig Start Date End Date Taking? Authorizing Provider  acetaminophen (TYLENOL) 325 MG tablet Take 2 tablets (650 mg total) by mouth every 6 (six) hours as needed for mild pain. 09/02/16   Beaulah Dinning, MD  aspirin 81 MG EC tablet Take 1 tablet (81 mg total) by mouth daily. 09/02/16   Beaulah Dinning, MD  atorvastatin (LIPITOR) 40 MG tablet Take 1 tablet (40 mg total) by mouth daily. 10/03/16   Teressa Lower, NP  atorvastatin (LIPITOR) 40 MG tablet Take 1 tablet (40 mg total) by mouth daily. 10/31/16   Arby Barrette, MD  augmented betamethasone dipropionate (DIPROLENE) 0.05 % ointment Apply topically 2 (two) times daily. To knee.  Do not apply longer than 2 weeks. Patient not taking: Reported on 07/04/2016 05/04/16   Collie Siad English, PA  cetirizine (ZYRTEC) 10 MG tablet Take 1 tablet (10 mg total) by mouth daily. 05/04/16   Collie Siad English, PA  citalopram (CELEXA) 20 MG tablet Take 1 tablet (20 mg total) by mouth daily. 03/27/13 03/27/14  Myrlene Broker, MD  docusate sodium (COLACE) 100 MG capsule Take 1 capsule (100 mg total) by mouth every 12 (twelve) hours.  06/14/16   Renne CriglerJoshua Geiple, PA-C  gabapentin (NEURONTIN) 300 MG capsule Take 1 capsule (300 mg total) by mouth 2 (two) times daily. 10/03/16   Teressa LowerVrinda Pickering, NP  gabapentin (NEURONTIN) 300 MG capsule Take 1 capsule (300 mg total) by mouth 2 (two) times daily. 10/31/16   Arby BarretteMarcy Sidnie Swalley, MD  hydrochlorothiazide (HYDRODIURIL) 25 MG tablet Take 1 tablet (25 mg total) by mouth daily. 10/31/16   Arby BarretteMarcy Chriss Redel, MD  HYDROcodone-acetaminophen (NORCO/VICODIN) 5-325 MG tablet Take 1-2 tablets by mouth every 4 (four) hours as needed for moderate pain. Patient not taking: Reported on 07/04/2016 06/02/16   Sherren MochaEva N Shaw, MD  lisinopril-hydrochlorothiazide (ZESTORETIC) 20-12.5 MG tablet Take 1 tablet by mouth daily. 06/02/16   Sherren MochaEva N Shaw,  MD  metoprolol succinate (TOPROL XL) 25 MG 24 hr tablet Take 1 tablet (25 mg total) by mouth daily. 09/02/16   Beaulah Dinninghristina M Gambino, MD  metoprolol succinate (TOPROL-XL) 25 MG 24 hr tablet Take 1 tablet (25 mg total) by mouth daily. 10/31/16   Arby BarretteMarcy Boyde Grieco, MD  montelukast (SINGULAIR) 10 MG tablet Take 1 tablet (10 mg total) by mouth at bedtime. 05/04/16   Collie SiadStephanie D English, PA  mupirocin ointment (BACTROBAN) 2 % Place 1 application into the nose 2 (two) times daily. Apply to scalp. Patient not taking: Reported on 07/04/2016 05/04/16   Collie SiadStephanie D English, PA  naproxen (NAPROSYN) 500 MG tablet Take 1 tablet (500 mg total) by mouth 2 (two) times daily with a meal. 07/04/16   Pricilla LovelessScott Goldston, MD  oxyCODONE (OXY IR/ROXICODONE) 5 MG immediate release tablet Take 0.5-1 tablets (2.5-5 mg total) by mouth every 6 (six) hours as needed for severe pain. 06/16/16   Arthor CaptainAbigail Harris, PA-C  oxyCODONE-acetaminophen (PERCOCET/ROXICET) 5-325 MG tablet Take 1-2 tablets by mouth every 6 (six) hours as needed for severe pain. Patient not taking: Reported on 07/04/2016 06/14/16   Renne CriglerJoshua Geiple, PA-C  polyethylene glycol powder (GLYCOLAX/MIRALAX) powder Take 17 g by mouth daily. 06/14/16   Renne CriglerJoshua Geiple, PA-C  Triamcinolone Acetonide 0.025 % LOTN Apply 1 application topically daily. To face.  Avoid eyelids.  No longer than 2 weeks. Patient not taking: Reported on 07/04/2016 05/04/16   Garnetta BuddyStephanie D English, PA    Family History Family History  Problem Relation Age of Onset  . Arthritis Mother   . Leukemia Father     Social History Social History  Substance Use Topics  . Smoking status: Never Smoker  . Smokeless tobacco: Never Used  . Alcohol use No     Allergies   Penicillins   Review of Systems Review of Systems 10 Systems reviewed and are negative for acute change except as noted in the HPI.   Physical Exam Updated Vital Signs BP 145/79   Pulse (!) 57   Temp 97.9 F (36.6 C) (Oral)   Resp 24   Wt 227 lb (103  kg)   SpO2 100%   BMI 44.33 kg/m   Physical Exam  Constitutional: She is oriented to person, place, and time.  Patient is morbidly obese. She is alert and nontoxic. No respiratory distress.  HENT:  Head: Normocephalic and atraumatic.  Eyes: EOM are normal.  Neck: Neck supple.  Cardiovascular: Normal rate, regular rhythm, normal heart sounds and intact distal pulses.   Pulmonary/Chest: Effort normal and breath sounds normal.  Patient has very faint rounded scars on the anterior right chest above the breast. These are consistent with fairly well healed varicella rash. No residual erythema or moist lesions. Patient endorses discomfort  with palpation throughout the right chest.  Abdominal: She exhibits no distension. There is no tenderness.  Musculoskeletal:  Patient has 2+ pitting edema bilateral feet and lower legs. Mild diffuse erythema bilaterally. No appearance of active cellulitis. No open wounds or lesions.  Neurological: She is alert and oriented to person, place, and time. No cranial nerve deficit. She exhibits normal muscle tone. Coordination normal.  Skin: Skin is warm and dry.  Psychiatric: She has a normal mood and affect.     ED Treatments / Results  Labs (all labs ordered are listed, but only abnormal results are displayed) Labs Reviewed  BASIC METABOLIC PANEL  CBC  I-STAT TROPOININ, ED    EKG  EKG Interpretation  Date/Time:  Wednesday October 31 2016 08:52:15 EST Ventricular Rate:  69 PR Interval:  194 QRS Duration: 106 QT Interval:  398 QTC Calculation: 426 R Axis:   -33 Text Interpretation:  Normal sinus rhythm with sinus arrhythmia Left axis deviation Incomplete right bundle branch block Cannot rule out Anterior infarct , age undetermined T wave abnormality, consider lateral ischemia Abnormal ECG lateral dynamic T wave compared to old. Confirmed by Donnald GarrePfeiffer, MD, Lebron ConnersMarcy [id number]) on 10/31/2016 12:09:06 PM       Radiology Dg Chest 2 View  Result Date:  10/31/2016 CLINICAL DATA:  Chickenpox 5 months ago, persistent rash, RIGHT breast pain EXAM: CHEST  2 VIEW COMPARISON:  08/31/2016 FINDINGS: Enlargement of cardiac silhouette with pulmonary vascular congestion. Mediastinal contours and pulmonary vascularity normal. Mild bronchitic changes. Lungs clear. No pleural effusion or pneumothorax. IMPRESSION: Enlargement of cardiac silhouette with pulmonary vascular congestion. Bronchitic changes without acute infiltrate. Electronically Signed   By: Ulyses SouthwardMark  Boles M.D.   On: 10/31/2016 09:41    Procedures Procedures (including critical care time)  Medications Ordered in ED Medications - No data to display   Initial Impression / Assessment and Plan / ED Course  I have reviewed the triage vital signs and the nursing notes.  Pertinent labs & imaging results that were available during my care of the patient were reviewed by me and considered in my medical decision making (see chart for details).  Clinical Course     Final Clinical Impressions(s) / ED Diagnoses   Final diagnoses:  Peripheral edema  Other chronic pain  Prescription refill   I have reviewed the patient's EMR. She did have hospitalization for chest pain in October. At that time, pain was felt to be noncardiac. Patient's history and description of pain is very suggestive of chronic neuropathic pain. I doubt cardiac ischemic problems as etiology of this. EMR also shows the patient had cardiac echo was significant amount of LVH without ischemic wall motion changes. Patient does have morbid obesity and is certainly a risk for subsequent cardiac dysfunction. She will be given hydrochlorothiazide to take for lower extremity edema and refill of metoprolol is provided. Also Lipitor is refilled. Patient's is counseled on the importance of establishing primary care follow-up. New Prescriptions New Prescriptions   ATORVASTATIN (LIPITOR) 40 MG TABLET    Take 1 tablet (40 mg total) by mouth daily.    GABAPENTIN (NEURONTIN) 300 MG CAPSULE    Take 1 capsule (300 mg total) by mouth 2 (two) times daily.   HYDROCHLOROTHIAZIDE (HYDRODIURIL) 25 MG TABLET    Take 1 tablet (25 mg total) by mouth daily.   METOPROLOL SUCCINATE (TOPROL-XL) 25 MG 24 HR TABLET    Take 1 tablet (25 mg total) by mouth daily.     Arby BarretteMarcy Ora Mcnatt, MD 10/31/16  1241  

## 2016-12-12 ENCOUNTER — Encounter (HOSPITAL_COMMUNITY): Payer: Self-pay | Admitting: Emergency Medicine

## 2016-12-12 ENCOUNTER — Emergency Department (HOSPITAL_COMMUNITY)
Admission: EM | Admit: 2016-12-12 | Discharge: 2016-12-12 | Disposition: A | Discharge status: Home / Self Care | Payer: Self-pay | Attending: Emergency Medicine | Admitting: Emergency Medicine

## 2016-12-12 ENCOUNTER — Emergency Department (HOSPITAL_COMMUNITY): Payer: Self-pay

## 2016-12-12 DIAGNOSIS — I10 Essential (primary) hypertension: Secondary | ICD-10-CM | POA: Insufficient documentation

## 2016-12-12 DIAGNOSIS — J45901 Unspecified asthma with (acute) exacerbation: Secondary | ICD-10-CM | POA: Insufficient documentation

## 2016-12-12 DIAGNOSIS — R21 Rash and other nonspecific skin eruption: Secondary | ICD-10-CM | POA: Insufficient documentation

## 2016-12-12 DIAGNOSIS — Z7982 Long term (current) use of aspirin: Secondary | ICD-10-CM | POA: Insufficient documentation

## 2016-12-12 DIAGNOSIS — R0602 Shortness of breath: Secondary | ICD-10-CM | POA: Insufficient documentation

## 2016-12-12 DIAGNOSIS — Z79899 Other long term (current) drug therapy: Secondary | ICD-10-CM | POA: Insufficient documentation

## 2016-12-12 LAB — CBC WITH DIFFERENTIAL/PLATELET
BASOS ABS: 0 10*3/uL (ref 0.0–0.1)
Basophils Relative: 0 %
EOS PCT: 4 %
Eosinophils Absolute: 0.3 10*3/uL (ref 0.0–0.7)
HCT: 40.7 % (ref 36.0–46.0)
HEMOGLOBIN: 13.7 g/dL (ref 12.0–15.0)
LYMPHS ABS: 1.5 10*3/uL (ref 0.7–4.0)
LYMPHS PCT: 18 %
MCH: 28.2 pg (ref 26.0–34.0)
MCHC: 33.7 g/dL (ref 30.0–36.0)
MCV: 83.7 fL (ref 78.0–100.0)
Monocytes Absolute: 0.2 10*3/uL (ref 0.1–1.0)
Monocytes Relative: 2 %
NEUTROS ABS: 6.5 10*3/uL (ref 1.7–7.7)
NEUTROS PCT: 76 %
PLATELETS: 204 10*3/uL (ref 150–400)
RBC: 4.86 MIL/uL (ref 3.87–5.11)
RDW: 12.8 % (ref 11.5–15.5)
WBC: 8.6 10*3/uL (ref 4.0–10.5)

## 2016-12-12 LAB — BASIC METABOLIC PANEL
ANION GAP: 11 (ref 5–15)
BUN: 29 mg/dL — ABNORMAL HIGH (ref 6–20)
CHLORIDE: 107 mmol/L (ref 101–111)
CO2: 22 mmol/L (ref 22–32)
Calcium: 8.9 mg/dL (ref 8.9–10.3)
Creatinine, Ser: 1.05 mg/dL — ABNORMAL HIGH (ref 0.44–1.00)
GFR calc Af Amer: >60 mL/min (ref >60)
GFR, EST NON AFRICAN AMERICAN: 58 mL/min — AB (ref >60)
GLUCOSE: 188 mg/dL — AB (ref 65–99)
POTASSIUM: 3 mmol/L — AB (ref 3.5–5.1)
Sodium: 140 mmol/L (ref 135–145)

## 2016-12-12 LAB — BRAIN NATRIURETIC PEPTIDE: B NATRIURETIC PEPTIDE 5: 288.9 pg/mL — AB (ref 0.0–100.0)

## 2016-12-12 MED ORDER — ALBUTEROL SULFATE HFA 108 (90 BASE) MCG/ACT IN AERS: 2.0000 | INHALATION_SPRAY | RESPIRATORY_TRACT | 3 refills | Status: AC | PRN

## 2016-12-12 MED ORDER — PREDNISONE 50 MG PO TABS: 50.0000 mg | ORAL_TABLET | Freq: Every day | ORAL | 0 refills | Status: DC

## 2016-12-12 MED ORDER — ALBUTEROL SULFATE (2.5 MG/3ML) 0.083% IN NEBU
5.0000 mg | INHALATION_SOLUTION | Freq: Once | RESPIRATORY_TRACT | Status: AC
  Administered 2016-12-12: 5 mg via RESPIRATORY_TRACT
  Filled 2016-12-12: qty 6

## 2016-12-12 MED ORDER — POTASSIUM CHLORIDE ER 10 MEQ PO TBCR: 10.0000 meq | EXTENDED_RELEASE_TABLET | Freq: Two times a day (BID) | ORAL | 0 refills | Status: DC

## 2016-12-12 MED ORDER — POTASSIUM CHLORIDE CRYS ER 20 MEQ PO TBCR
40.0000 meq | EXTENDED_RELEASE_TABLET | Freq: Once | ORAL | Status: AC
  Administered 2016-12-12: 40 meq via ORAL
  Filled 2016-12-12: qty 2

## 2016-12-12 MED ORDER — FUROSEMIDE 20 MG PO TABS: 20.0000 mg | ORAL_TABLET | Freq: Every day | ORAL | 0 refills | Status: AC

## 2016-12-12 NOTE — ED Triage Notes (Signed)
Per EMS, patient was at home when she became extremely short of breath. Neighbor called EMS.  Patient does not speak [place], only [place].  Hx of hypertension and asthma.  Cyanotic when EMS arrived at 70% on RA.  Given Albuterol en route and 125mg  of solu medrol.  Unremarkable EKG.  Audible wheezing without stethoscope prior to arrival.  192/94, HR 90, RR 40 prior to albuterol and RR 20 after treatment.  IV 20G left AC.

## 2016-12-12 NOTE — ED Provider Notes (Signed)
[facility]-EMERGENCY DEPT Provider Note   CSN: [id number] Arrival date & time: 12/12/16  1706     History   Chief Complaint Chief Complaint  Patient presents with  . Asthma    HPI Felicia Jenkins is a 56 y.o. female.  The history is provided by the patient.  Shortness of Breath  This is a recurrent problem. The average episode lasts 2 weeks. The problem occurs intermittently.The current episode started more than 1 week ago. The problem has been rapidly worsening. Associated symptoms include cough (mild), wheezing, chest pain (from post herpetic neuralgia per patient last 10months), rash (L knee for 40yr), leg pain (L knee pain for 75yr) and leg swelling (b/l). Pertinent negatives include no fever, no headaches, no sputum production, no vomiting and no abdominal pain. Precipitated by: cold weather. Treatments tried: no inhalers/nebulizer at home, pt got albuterol and solumedrol with EMS. The treatment provided significant relief. Associated medical issues include asthma. Associated medical issues do not include chronic lung disease, heart failure, past MI or DVT.  -Pt notes L knee rash last 3 days after applying a OTC "dragon" creme for knee pain.  Past Medical History:  Diagnosis Date  . Hypertension   . Hypertension    untreated  . Obesity   . Shingles     Patient Active Problem List   Diagnosis Date Noted  . DOE (dyspnea on exertion)   . Elevated troponin 08/31/2016  . Post herpetic neuralgia   . Benign essential HTN 05/07/2016  . Depression 03/29/2013  . HLD (hyperlipidemia) 02/02/2013  . HTN (hypertension) 01/29/2013  . Chest pain 01/29/2013    Past Surgical History:  Procedure Laterality Date  . ABDOMINAL HYSTERECTOMY    . APPENDECTOMY    . OVARIAN CYST REMOVAL      OB History    No data available       Home Medications    Prior to Admission medications   Medication Sig Start Date End Date Taking? Authorizing Provider  acetaminophen (TYLENOL) 325  MG tablet Take 2 tablets (650 mg total) by mouth every 6 (six) hours as needed for mild pain. 09/02/16   Beaulah Dinning, MD  albuterol (PROVENTIL HFA;VENTOLIN HFA) 108 (90 Base) MCG/ACT inhaler Inhale 2 puffs into the lungs every 4 (four) hours as needed for wheezing or shortness of breath. 12/12/16   Dwana Melena, DO  aspirin 81 MG EC tablet Take 1 tablet (81 mg total) by mouth daily. 09/02/16   Beaulah Dinning, MD  atorvastatin (LIPITOR) 40 MG tablet Take 1 tablet (40 mg total) by mouth daily. 10/03/16   Teressa Lower, NP  atorvastatin (LIPITOR) 40 MG tablet Take 1 tablet (40 mg total) by mouth daily. 10/31/16   Arby Barrette, MD  augmented betamethasone dipropionate (DIPROLENE) 0.05 % ointment Apply topically 2 (two) times daily. To knee.  Do not apply longer than 2 weeks. Patient not taking: Reported on 07/04/2016 05/04/16   Collie Siad English, PA  cetirizine (ZYRTEC) 10 MG tablet Take 1 tablet (10 mg total) by mouth daily. 05/04/16   Collie Siad English, PA  citalopram (CELEXA) 20 MG tablet Take 1 tablet (20 mg total) by mouth daily. 03/27/13 03/27/14  Myrlene Broker, MD  docusate sodium (COLACE) 100 MG capsule Take 1 capsule (100 mg total) by mouth every 12 (twelve) hours. 06/14/16   Renne Crigler, PA-C  furosemide (LASIX) 20 MG tablet Take 1 tablet (20 mg total) by mouth daily. 12/12/16   Dwana Melena, DO  gabapentin (NEURONTIN) 300  MG capsule Take 1 capsule (300 mg total) by mouth 2 (two) times daily. 10/03/16   Teressa Lower, NP  gabapentin (NEURONTIN) 300 MG capsule Take 1 capsule (300 mg total) by mouth 2 (two) times daily. 10/31/16   Arby Barrette, MD  hydrochlorothiazide (HYDRODIURIL) 25 MG tablet Take 1 tablet (25 mg total) by mouth daily. 10/31/16   Arby Barrette, MD  HYDROcodone-acetaminophen (NORCO/VICODIN) 5-325 MG tablet Take 1-2 tablets by mouth every 4 (four) hours as needed for moderate pain. Patient not taking: Reported on 07/04/2016 06/02/16   Sherren Mocha, MD    lisinopril-hydrochlorothiazide (ZESTORETIC) 20-12.5 MG tablet Take 1 tablet by mouth daily. 06/02/16   Sherren Mocha, MD  metoprolol succinate (TOPROL XL) 25 MG 24 hr tablet Take 1 tablet (25 mg total) by mouth daily. 09/02/16   Beaulah Dinning, MD  metoprolol succinate (TOPROL-XL) 25 MG 24 hr tablet Take 1 tablet (25 mg total) by mouth daily. 10/31/16   Arby Barrette, MD  montelukast (SINGULAIR) 10 MG tablet Take 1 tablet (10 mg total) by mouth at bedtime. 05/04/16   Collie Siad English, PA  mupirocin ointment (BACTROBAN) 2 % Place 1 application into the nose 2 (two) times daily. Apply to scalp. Patient not taking: Reported on 07/04/2016 05/04/16   Collie Siad English, PA  naproxen (NAPROSYN) 500 MG tablet Take 1 tablet (500 mg total) by mouth 2 (two) times daily with a meal. 07/04/16   Pricilla Loveless, MD  oxyCODONE (OXY IR/ROXICODONE) 5 MG immediate release tablet Take 0.5-1 tablets (2.5-5 mg total) by mouth every 6 (six) hours as needed for severe pain. 06/16/16   Arthor Captain, PA-C  oxyCODONE-acetaminophen (PERCOCET/ROXICET) 5-325 MG tablet Take 1-2 tablets by mouth every 6 (six) hours as needed for severe pain. Patient not taking: Reported on 07/04/2016 06/14/16   Renne Crigler, PA-C  polyethylene glycol powder (GLYCOLAX/MIRALAX) powder Take 17 g by mouth daily. 06/14/16   Renne Crigler, PA-C  potassium chloride (K-DUR) 10 MEQ tablet Take 1 tablet (10 mEq total) by mouth 2 (two) times daily. 12/12/16 12/19/16  Dwana Melena, DO  predniSONE (DELTASONE) 50 MG tablet Take 1 tablet (50 mg total) by mouth daily with breakfast. 12/13/16   Dwana Melena, DO  Triamcinolone Acetonide 0.025 % LOTN Apply 1 application topically daily. To face.  Avoid eyelids.  No longer than 2 weeks. Patient not taking: Reported on 07/04/2016 05/04/16   Garnetta Buddy, PA    Family History Family History  Problem Relation Age of Onset  . Arthritis Mother   . Leukemia Father     Social History Social History  Substance Use Topics   . Smoking status: Never Smoker  . Smokeless tobacco: Never Used  . Alcohol use No     Allergies   Penicillins   Review of Systems Review of Systems  Constitutional: Negative for chills and fever.  Respiratory: Positive for cough (mild), shortness of breath and wheezing. Negative for sputum production.   Cardiovascular: Positive for chest pain (from post herpetic neuralgia per patient last 10months) and leg swelling (b/l).  Gastrointestinal: Negative for abdominal pain, diarrhea, nausea and vomiting.  Skin: Positive for rash (L knee for 17yr).  Neurological: Negative for light-headedness and headaches.  All other systems reviewed and are negative.    Physical Exam Updated Vital Signs BP 143/56   Pulse 70   Resp 16   SpO2 100%   Physical Exam  Constitutional: She appears well-developed and well-nourished. No distress.  HENT:  Head: Normocephalic and  atraumatic.  Mouth/Throat: Oropharynx is clear and moist.  Eyes: Conjunctivae are normal.  Neck: Neck supple.  Cardiovascular: Normal rate and regular rhythm.   No murmur heard. Pulmonary/Chest: Effort normal. No respiratory distress. She has decreased breath sounds (diffuse). She has no wheezes. She has rales (L base).  Abdominal: Soft. There is no tenderness.  obese  Musculoskeletal: She exhibits no edema.  Neurological: She is alert.  Skin: Skin is warm and dry. She is not diaphoretic.     Psychiatric: She has a normal mood and affect.  Nursing note and vitals reviewed.    ED Treatments / Results  Labs (all labs ordered are listed, but only abnormal results are displayed) Labs Reviewed  BASIC METABOLIC PANEL - Abnormal; Notable for the following:       Result Value   Potassium 3.0 (*)    Glucose, Bld 188 (*)    BUN 29 (*)    Creatinine, Ser 1.05 (*)    GFR calc non Af Amer 58 (*)    All other components within normal limits  BRAIN NATRIURETIC PEPTIDE - Abnormal; Notable for the following:    B Natriuretic  Peptide 288.9 (*)    All other components within normal limits  CBC WITH DIFFERENTIAL/PLATELET    EKG  EKG Interpretation  Date/Time:  Wednesday December 12 2016 17:25:19 EST Ventricular Rate:  89 PR Interval:    QRS Duration: 116 QT Interval:  392 QTC Calculation: 477 R Axis:   7 Text Interpretation:  Sinus rhythm Borderline prolonged PR interval Probable left atrial enlargement Incomplete left bundle branch block Baseline wander in lead(s) I III aVL Otherwise no significant change Confirmed by [facility] MD, PEDRO ([id number]) on 12/12/2016 5:35:28 PM       Radiology Dg Chest 2 View  Result Date: 12/12/2016 CLINICAL DATA:  Extremely short of breath EXAM: CHEST  2 VIEW COMPARISON:  10/31/2016 FINDINGS: Stable cardiomegaly without overt failure. No acute infiltrate or effusion. No pneumothorax. IMPRESSION: 1. Stable cardiomegaly without overt failure 2. No acute infiltrate Electronically Signed   By: Jasmine Pang M.D.   On: 12/12/2016 18:07    Procedures Procedures (including critical care time)  Medications Ordered in ED Medications  albuterol (PROVENTIL) (2.5 MG/3ML) 0.083% nebulizer solution 5 mg (5 mg Nebulization Given 12/12/16 1833)  potassium chloride SA (K-DUR,KLOR-CON) CR tablet 40 mEq (40 mEq Oral Given 12/12/16 1955)     Initial Impression / Assessment and Plan / ED Course  I have reviewed the triage vital signs and the nursing notes.  Pertinent labs & imaging results that were available during my care of the patient were reviewed by me and considered in my medical decision making (see chart for details).  Clinical Course    Patient is a 56 year old Hispanic female who presents with respiratory distress. Patient has had 2 weeks of worsening shortness of breath and has history of asthma however does not have any inhalers or nebulizers at home.   EMS was called and patient initially had O2 sat of 70% with significant amount of wheezing. Patient given breathing treatment and  soluMedrol prior to arrival with significant improvement in respiratory status. Patient denies any fevers or chills at home. Patient also complains of left knee pain over the last year and she has been applying a cream over the last few days and developed a rash on the left knee which appears to be a irritant or contact dermatitis. Does not appear to be cellulitic and no concern about septic joint at  this time. Lung exam with decreased air movement but no wheezing and mild left basilar rales.   Plan to give her another breathing treatment and reassess. If patient maintaining her O2 sats plan to discharge home with albuterol inhaler and course of steroids. Patient does have pitting edema on exam so checking BNP as well. Patient not currently on Lasix.  BNP elevated at 288. Patient reevaluated and moving good air and has no more wheezing. Patient satting 99% on room air. Patient has no signs of pulmonary edema on chest x-ray but does have cardiomegaly. Potassium was found to be 3. Patient given 40 mEq oral potassium replacement in the ED. Patient will be discharged home with albuterol inhalers, prednisone burst, Lasix for 7 days and oral potassium for 7 days. Patient to follow up with PCP in 5 days for follow-up on her asthma, mild CHF workup, and make sure she has all the medication she needs and adequate follow-up. Patient to return to the ED if she has recurrence of her symptoms.  Pt seen with attending Dr. Eudelia Bunchardama.  Final Clinical Impressions(s) / ED Diagnoses   Final diagnoses:  Exacerbation of asthma, unspecified asthma severity, unspecified whether persistent    New Prescriptions New Prescriptions   ALBUTEROL (PROVENTIL HFA;VENTOLIN HFA) 108 (90 BASE) MCG/ACT INHALER    Inhale 2 puffs into the lungs every 4 (four) hours as needed for wheezing or shortness of breath.   FUROSEMIDE (LASIX) 20 MG TABLET    Take 1 tablet (20 mg total) by mouth daily.   POTASSIUM CHLORIDE (K-DUR) 10 MEQ TABLET     Take 1 tablet (10 mEq total) by mouth 2 (two) times daily.   PREDNISONE (DELTASONE) 50 MG TABLET    Take 1 tablet (50 mg total) by mouth daily with breakfast.     Dwana MelenaRobin Tika Hannis, DO 12/12/16 2004    Nira ConnPedro Eduardo Cardama, MD 12/13/16 0000

## 2017-01-05 ENCOUNTER — Encounter (HOSPITAL_COMMUNITY): Payer: Self-pay | Admitting: Emergency Medicine

## 2017-01-05 ENCOUNTER — Emergency Department (HOSPITAL_COMMUNITY)
Admission: EM | Admit: 2017-01-05 | Discharge: 2017-01-05 | Disposition: A | Discharge status: Home / Self Care | Payer: Self-pay | Attending: Emergency Medicine | Admitting: Emergency Medicine

## 2017-01-05 ENCOUNTER — Emergency Department (HOSPITAL_COMMUNITY): Payer: Self-pay

## 2017-01-05 DIAGNOSIS — Z7982 Long term (current) use of aspirin: Secondary | ICD-10-CM | POA: Insufficient documentation

## 2017-01-05 DIAGNOSIS — I10 Essential (primary) hypertension: Secondary | ICD-10-CM | POA: Insufficient documentation

## 2017-01-05 DIAGNOSIS — M25562 Pain in left knee: Secondary | ICD-10-CM

## 2017-01-05 DIAGNOSIS — M1712 Unilateral primary osteoarthritis, left knee: Secondary | ICD-10-CM

## 2017-01-05 DIAGNOSIS — Z79899 Other long term (current) drug therapy: Secondary | ICD-10-CM | POA: Insufficient documentation

## 2017-01-05 DIAGNOSIS — M179 Osteoarthritis of knee, unspecified: Secondary | ICD-10-CM | POA: Insufficient documentation

## 2017-01-05 MED ORDER — ATORVASTATIN CALCIUM 40 MG PO TABS: 40.0000 mg | ORAL_TABLET | Freq: Every day | ORAL | 0 refills | Status: AC

## 2017-01-05 MED ORDER — METOPROLOL SUCCINATE ER 25 MG PO TB24: 25.0000 mg | ORAL_TABLET | Freq: Every day | ORAL | 0 refills | Status: AC

## 2017-01-05 MED ORDER — TRAMADOL HCL 50 MG PO TABS: 50.0000 mg | ORAL_TABLET | Freq: Four times a day (QID) | ORAL | 0 refills | Status: AC | PRN

## 2017-01-05 MED ORDER — IBUPROFEN 400 MG PO TABS
400.0000 mg | ORAL_TABLET | Freq: Once | ORAL | Status: AC
Start: 2017-01-05 — End: 2017-01-05
  Administered 2017-01-05: 400 mg via ORAL
  Filled 2017-01-05: qty 1

## 2017-01-05 MED ORDER — TRAMADOL HCL 50 MG PO TABS
50.0000 mg | ORAL_TABLET | Freq: Once | ORAL | Status: AC
  Administered 2017-01-05: 50 mg via ORAL
  Filled 2017-01-05: qty 1

## 2017-01-05 NOTE — ED Notes (Signed)
Pt transported to xray 

## 2017-01-05 NOTE — ED Triage Notes (Signed)
[facility]  Her left knee is the one that is injured.  She has had pain for the last four years and it is not going away.  She is taking neurontin for shingles but she says it is not helping her knee pain.  Her pain 10/10.

## 2017-01-05 NOTE — ED Provider Notes (Addendum)
[facility]PT Provider Note   CSN: [id number] Arrival date & time: 01/05/17  1105     History   Chief Complaint Chief Complaint  Patient presents with  . Knee Pain    The patient [facility] years [facility]  Her left knee is the one that is injured.  She has had pain for the last four years and it is not going away.  She is taking neurontin for shingles but she says it is not helping her knee pain.    HPI Edmonia LynchValvina Gutierrez-Renteria is a 56 y.o. female.  Patient c/o left knee pain. Pain is constant, dull, mod-severe, worse w walking on knee.  Has had pain for past few years since a fall onto her knee, but is worse in past week. Denies recent fall or injury. No increased leg swelling. No erythema. No numbness/weaknesss to leg.  No hip or ankle pain. No fevers.  Has not seen orthopedist for same.    The history is provided by the patient.  Knee Pain   Pertinent negatives include no numbness.    Past Medical History:  Diagnosis Date  . Hypertension   . Hypertension    untreated  . Obesity   . Shingles     Patient Active Problem List   Diagnosis Date Noted  . DOE (dyspnea on exertion)   . Elevated troponin 08/31/2016  . Post herpetic neuralgia   . Benign essential HTN 05/07/2016  . Depression 03/29/2013  . HLD (hyperlipidemia) 02/02/2013  . HTN (hypertension) 01/29/2013  . Chest pain 01/29/2013    Past Surgical History:  Procedure Laterality Date  . ABDOMINAL HYSTERECTOMY    . APPENDECTOMY    . OVARIAN CYST REMOVAL      OB History    No data available       Home Medications    Prior to Admission medications   Medication Sig Start Date End Date Taking? Authorizing Provider  atorvastatin (LIPITOR) 40 MG tablet Take 1 tablet (40 mg total) by mouth daily. 10/31/16  Yes Arby BarretteMarcy Pfeiffer, MD  gabapentin (NEURONTIN) 300 MG capsule Take 1 capsule (300 mg total) by mouth 2 (two) times daily. 10/31/16  Yes Arby BarretteMarcy Pfeiffer, MD    ibuprofen (ADVIL,MOTRIN) 200 MG tablet Take 400 mg by mouth every 6 (six) hours as needed for headache or moderate pain.   Yes Historical Provider, MD  metoprolol succinate (TOPROL XL) 25 MG 24 hr tablet Take 1 tablet (25 mg total) by mouth daily. 09/02/16  Yes Beaulah Dinninghristina M Gambino, MD  acetaminophen (TYLENOL) 325 MG tablet Take 2 tablets (650 mg total) by mouth every 6 (six) hours as needed for mild pain. Patient not taking: Reported on 01/05/2017 09/02/16   Beaulah Dinninghristina M Gambino, MD  albuterol (PROVENTIL HFA;VENTOLIN HFA) 108 (90 Base) MCG/ACT inhaler Inhale 2 puffs into the lungs every 4 (four) hours as needed for wheezing or shortness of breath. 12/12/16   Dwana Melenaobin Dong, DO  aspirin 81 MG EC tablet Take 1 tablet (81 mg total) by mouth daily. Patient not taking: Reported on 01/05/2017 09/02/16   Beaulah Dinninghristina M Gambino, MD  atorvastatin (LIPITOR) 40 MG tablet Take 1 tablet (40 mg total) by mouth daily. Patient not taking: Reported on 01/05/2017 10/03/16   Teressa LowerVrinda Pickering, NP  augmented betamethasone dipropionate (DIPROLENE) 0.05 % ointment Apply topically 2 (two) times daily. To knee.  Do not apply longer than 2 weeks. Patient not taking: Reported on 07/04/2016 05/04/16   Collie SiadStephanie D English,  PA  cetirizine (ZYRTEC) 10 MG tablet Take 1 tablet (10 mg total) by mouth daily. Patient not taking: Reported on 01/05/2017 05/04/16   Collie Siad English, PA  citalopram (CELEXA) 20 MG tablet Take 1 tablet (20 mg total) by mouth daily. Patient not taking: Reported on 01/05/2017 03/27/13 03/27/14  Myrlene Broker, MD  docusate sodium (COLACE) 100 MG capsule Take 1 capsule (100 mg total) by mouth every 12 (twelve) hours. Patient not taking: Reported on 01/05/2017 06/14/16   Renne Crigler, PA-C  furosemide (LASIX) 20 MG tablet Take 1 tablet (20 mg total) by mouth daily. Patient not taking: Reported on 01/05/2017 12/12/16   Dwana Melena, DO  gabapentin (NEURONTIN) 300 MG capsule Take 1 capsule (300 mg total) by mouth 2 (two) times  daily. Patient not taking: Reported on 01/05/2017 10/03/16   Teressa Lower, NP  hydrochlorothiazide (HYDRODIURIL) 25 MG tablet Take 1 tablet (25 mg total) by mouth daily. Patient not taking: Reported on 01/05/2017 10/31/16   Arby Barrette, MD  HYDROcodone-acetaminophen (NORCO/VICODIN) 5-325 MG tablet Take 1-2 tablets by mouth every 4 (four) hours as needed for moderate pain. Patient not taking: Reported on 07/04/2016 06/02/16   Sherren Mocha, MD  lisinopril-hydrochlorothiazide (ZESTORETIC) 20-12.5 MG tablet Take 1 tablet by mouth daily. Patient not taking: Reported on 01/05/2017 06/02/16   Sherren Mocha, MD  metoprolol succinate (TOPROL-XL) 25 MG 24 hr tablet Take 1 tablet (25 mg total) by mouth daily. Patient not taking: Reported on 01/05/2017 10/31/16   Arby Barrette, MD  montelukast (SINGULAIR) 10 MG tablet Take 1 tablet (10 mg total) by mouth at bedtime. Patient not taking: Reported on 01/05/2017 05/04/16   Collie Siad English, PA  mupirocin ointment (BACTROBAN) 2 % Place 1 application into the nose 2 (two) times daily. Apply to scalp. Patient not taking: Reported on 07/04/2016 05/04/16   Collie Siad English, PA  naproxen (NAPROSYN) 500 MG tablet Take 1 tablet (500 mg total) by mouth 2 (two) times daily with a meal. Patient not taking: Reported on 01/05/2017 07/04/16   Pricilla Loveless, MD  oxyCODONE (OXY IR/ROXICODONE) 5 MG immediate release tablet Take 0.5-1 tablets (2.5-5 mg total) by mouth every 6 (six) hours as needed for severe pain. Patient not taking: Reported on 01/05/2017 06/16/16   Arthor Captain, PA-C  oxyCODONE-acetaminophen (PERCOCET/ROXICET) 5-325 MG tablet Take 1-2 tablets by mouth every 6 (six) hours as needed for severe pain. Patient not taking: Reported on 07/04/2016 06/14/16   Renne Crigler, PA-C  polyethylene glycol powder (GLYCOLAX/MIRALAX) powder Take 17 g by mouth daily. Patient not taking: Reported on 01/05/2017 06/14/16   Renne Crigler, PA-C  potassium chloride (K-DUR) 10 MEQ tablet Take 1 tablet (10  mEq total) by mouth 2 (two) times daily. Patient not taking: Reported on 01/05/2017 12/12/16 12/19/16  Dwana Melena, DO  predniSONE (DELTASONE) 50 MG tablet Take 1 tablet (50 mg total) by mouth daily with breakfast. Patient not taking: Reported on 01/05/2017 12/13/16   Dwana Melena, DO  Triamcinolone Acetonide 0.025 % LOTN Apply 1 application topically daily. To face.  Avoid eyelids.  No longer than 2 weeks. Patient not taking: Reported on 07/04/2016 05/04/16   Garnetta Buddy, PA    Family History Family History  Problem Relation Age of Onset  . Arthritis Mother   . Leukemia Father     Social History Social History  Substance Use Topics  . Smoking status: Never Smoker  . Smokeless tobacco: Never Used  . Alcohol use No  Allergies   Penicillins   Review of Systems Review of Systems  Constitutional: Negative for fever.  Respiratory: Negative for shortness of breath.   Cardiovascular: Negative for chest pain.  Skin: Negative for rash.  Neurological: Negative for weakness and numbness.     Physical Exam Updated Vital Signs BP 123/72 (BP Location: Left Arm)   Temp 98.6 F (37 C) (Oral)   Resp 23   SpO2 97%   Physical Exam  Constitutional: She appears well-developed and well-nourished. No distress.  HENT:  Head: Atraumatic.  Eyes: Conjunctivae are normal. No scleral icterus.  Neck: Neck supple. No tracheal deviation present.  Cardiovascular: Normal rate and intact distal pulses.   Pulmonary/Chest: Effort normal. No respiratory distress.  Abdominal: Normal appearance. She exhibits distension.  Musculoskeletal: She exhibits no edema.  +tenderness left knee, and crepitus with rom. No pain w passive rom knee.  No effusion. Grossly stable. Distal pulses palp. No lower leg or calf pain, swelling or tenderness.   Neurological: She is alert.  Skin: Skin is warm and dry. No rash noted.  Psychiatric: She has a normal mood and affect.  Nursing note and vitals reviewed.    ED  Treatments / Results  Labs (all labs ordered are listed, but only abnormal results are displayed) Labs Reviewed - No data to display  EKG  EKG Interpretation None       Radiology Dg Knee Complete 4 Views Left  Result Date: 01/05/2017 CLINICAL DATA:  Left knee pain, no reported recent injury EXAM: LEFT KNEE - COMPLETE 4+ VIEW COMPARISON:  01/29/2013 left knee radiographs FINDINGS: No fracture, joint effusion or dislocation. There is a 1.0 cm calcified round structure in the suprapatellar left knee joint space. No suspicious focal osseous lesion. Moderate medial compartment, mild lateral compartment and moderate patellofemoral compartment osteoarthritis with interval progression. No radiopaque foreign body. IMPRESSION: 1. No fracture, joint effusion or dislocation. 2. Calcified 1.0 cm round structure in the suprapatellar left knee joint space, which may represent a loose intra-articular osseous body. 3. Moderate tricompartmental left knee osteoarthritis with interval progression. Electronically Signed   By: Delbert Phenix M.D.   On: 01/05/2017 12:37    Procedures Procedures (including critical care time)  Medications Ordered in ED Medications  traMADol (ULTRAM) tablet 50 mg (50 mg Oral Given 01/05/17 1210)  ibuprofen (ADVIL,MOTRIN) tablet 400 mg (400 mg Oral Given 01/05/17 1210)     Initial Impression / Assessment and Plan / ED Course  I have reviewed the triage vital signs and the nursing notes.  Pertinent labs & imaging results that were available during my care of the patient were reviewed by me and considered in my medical decision making (see chart for details).  Xrays.  Ultram po, and motrin po, for pain.  Discussed need orthopedic follow up, possible djd, insuff cart/injury, other soft tissue/ligamentous problem.   Currently no sign of infection or septic joint.  No leg swelling as compared to right.   Pt requests refill/new rx for her metoprolol 25 mg a day, and lipitor 40 mg  a day.    Final Clinical Impressions(s) / ED Diagnoses   Final diagnoses:  None    New Prescriptions New Prescriptions   No medications on file         Cathren Laine, MD 01/05/17 1311

## 2017-01-05 NOTE — Discharge Instructions (Addendum)
It was our pleasure to provide your ER care today - we hope that you feel better.  Your xrays show moderate-severe osteoarthritis in the left knee, as well as a possible loose body.    Follow up with orthopedic doctor in the next 1-2 weeks - call office this Monday AM to arrange appointment.  You may wear a knee brace/wrap as need for comfort/support.   Take motrin or aleve as need for pain.  Take with food.   You may also take ultram as need for pain - no driving when taking.   Return to ER if worse, new symptoms, fevers, severe swelling, other concern.

## 2017-01-05 NOTE — ED Notes (Signed)
Patient is alert and orientedx4.  Patient was explained discharge instructions and they understood them with no questions.   

## 2017-01-11 ENCOUNTER — Ambulatory Visit: Payer: Self-pay

## 2017-03-25 IMAGING — CR DG CHEST 2V
2 series · 2 of 2 positions shown · non-contrast
Comparison: 08/31/2016

CLINICAL DATA: Chickenpox 5 months ago, persistent rash, RIGHT
breast pain

EXAM:
CHEST  2 VIEW

[chest pa]
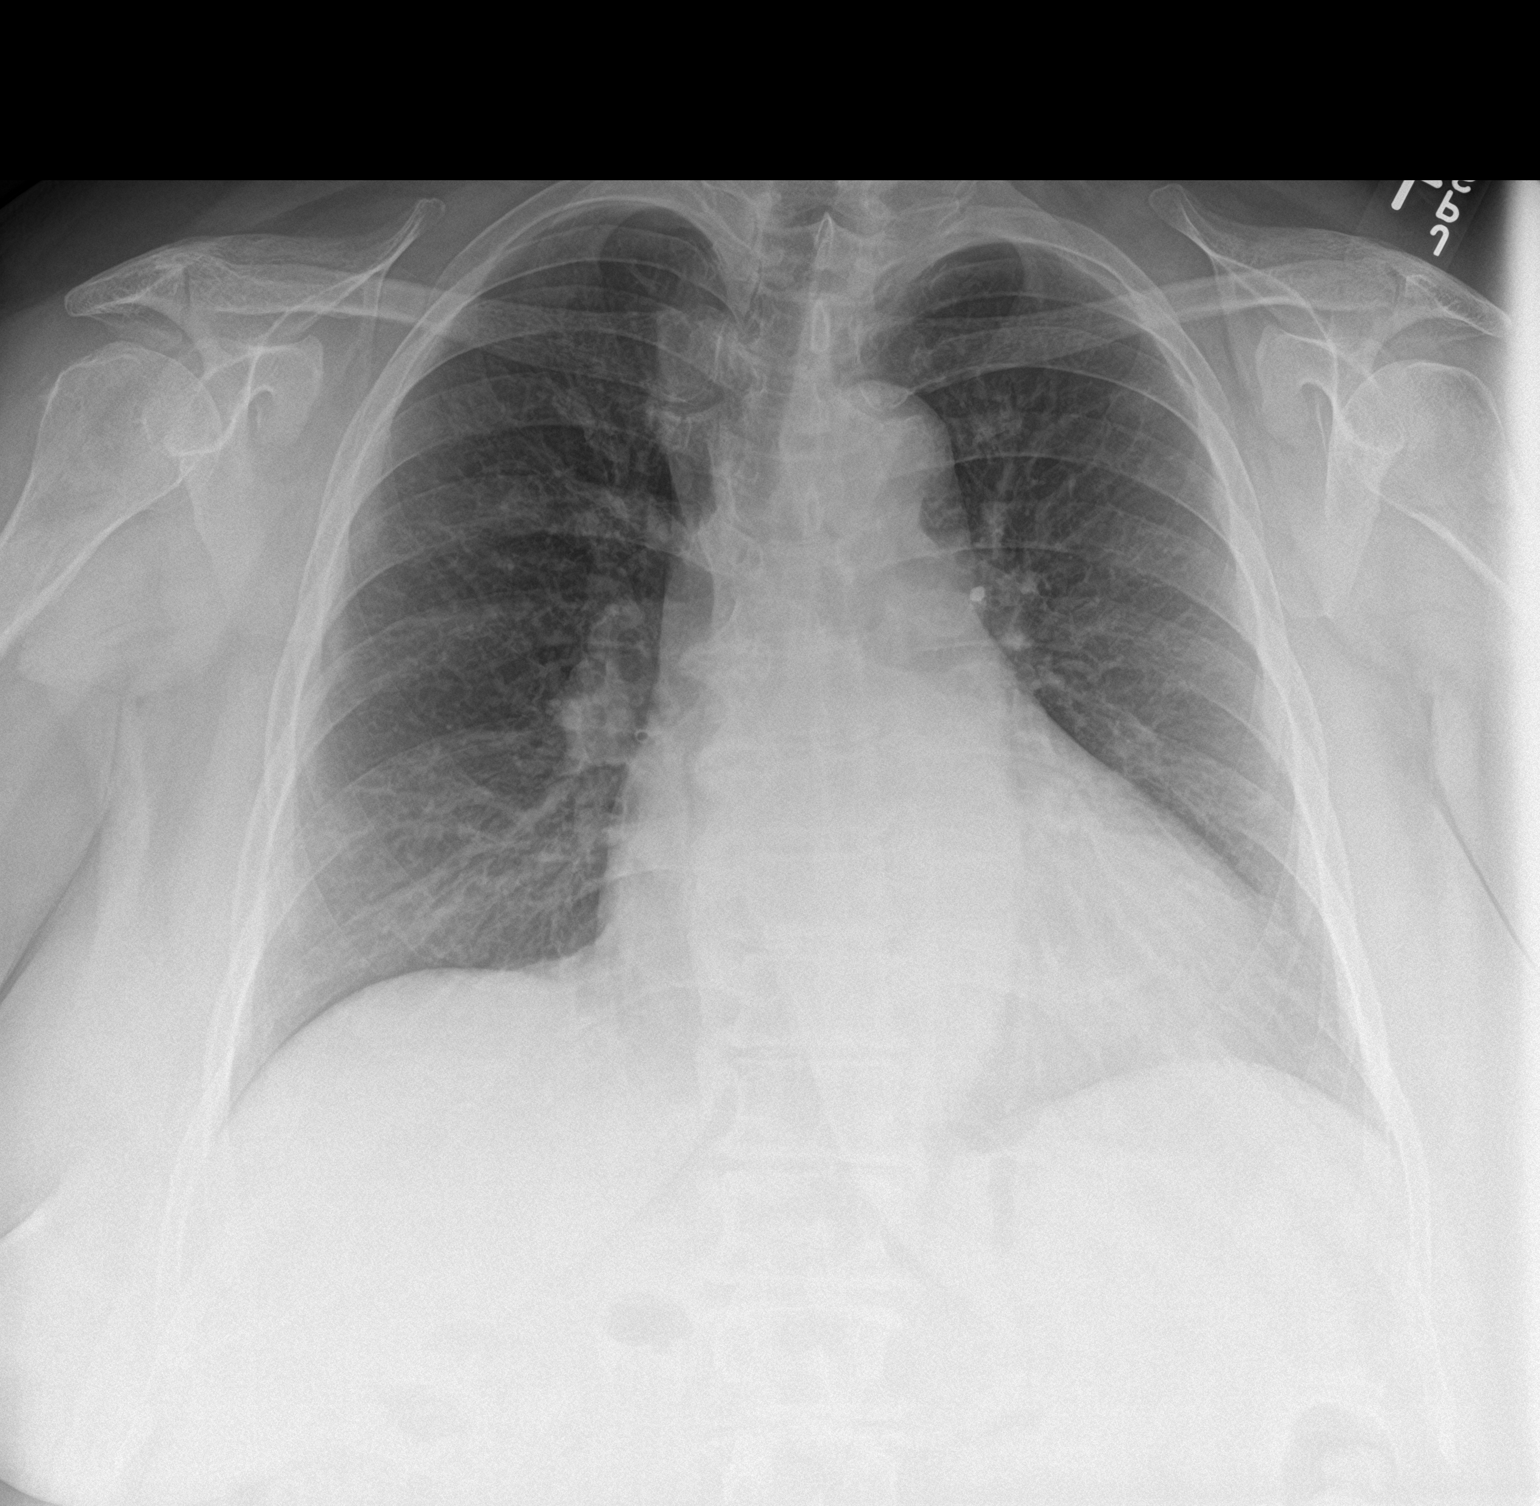

[chest lat]
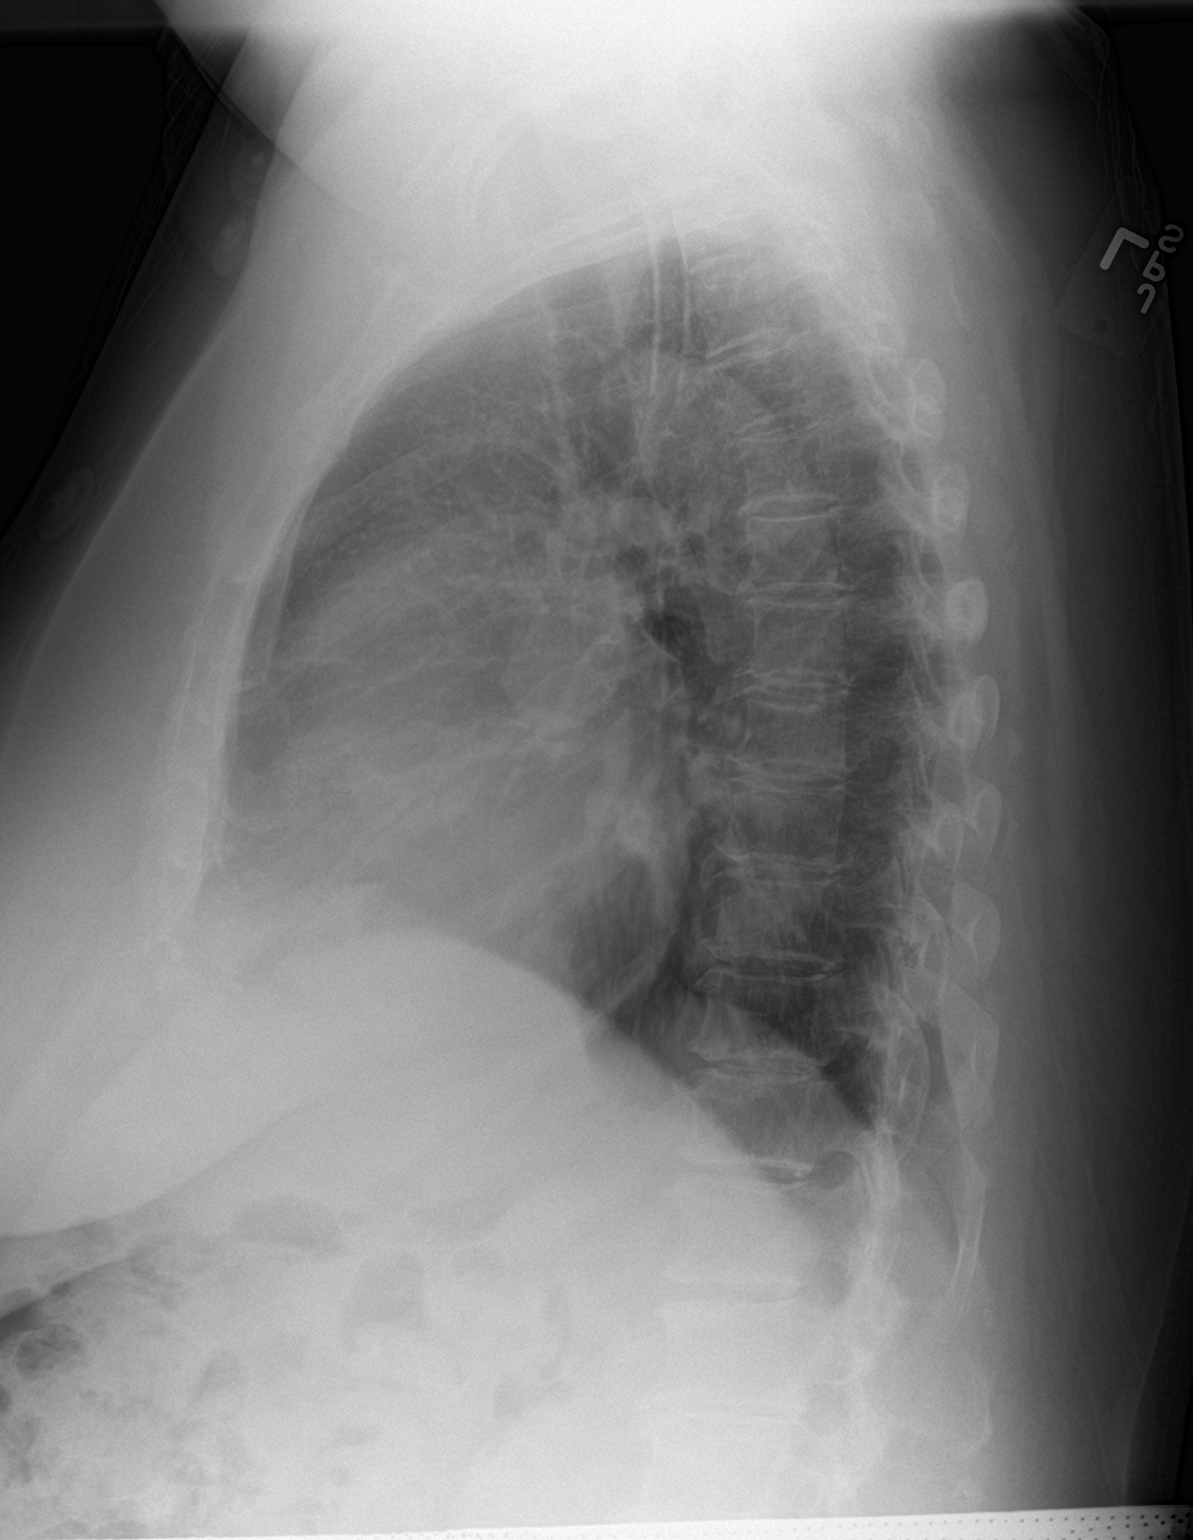

[2 of 2 positions shown; findings below may reference images not displayed]

FINDINGS: Enlargement of cardiac silhouette with pulmonary vascular
congestion.

Mediastinal contours and pulmonary vascularity normal.

Mild bronchitic changes.

Lungs clear.

No pleural effusion or pneumothorax.
IMPRESSION: Enlargement of cardiac silhouette with pulmonary vascular
congestion.

Bronchitic changes without acute infiltrate.

## 2017-04-06 ENCOUNTER — Encounter (HOSPITAL_COMMUNITY): Payer: Self-pay | Admitting: Emergency Medicine

## 2017-04-06 ENCOUNTER — Emergency Department (HOSPITAL_COMMUNITY)
Admission: EM | Admit: 2017-04-06 | Discharge: 2017-04-07 | Disposition: A | Discharge status: Home / Self Care | Payer: Self-pay | Attending: Emergency Medicine | Admitting: Emergency Medicine

## 2017-04-06 DIAGNOSIS — M79604 Pain in right leg: Secondary | ICD-10-CM | POA: Insufficient documentation

## 2017-04-06 DIAGNOSIS — Z79899 Other long term (current) drug therapy: Secondary | ICD-10-CM | POA: Insufficient documentation

## 2017-04-06 DIAGNOSIS — I10 Essential (primary) hypertension: Secondary | ICD-10-CM | POA: Insufficient documentation

## 2017-04-06 NOTE — ED Triage Notes (Signed)
Reports having pain in right leg and knee that started 3 days ago.  Was seen today by physician and given injection for pain and sent home with scripts.  Reports last dose of medication at 2pm with no relief of pain.  Tearful in triage.

## 2017-04-07 ENCOUNTER — Ambulatory Visit (HOSPITAL_COMMUNITY): Admission: RE | Admit: 2017-04-07 | Payer: Self-pay | Source: Ambulatory Visit

## 2017-04-07 ENCOUNTER — Emergency Department (HOSPITAL_COMMUNITY): Payer: Self-pay

## 2017-04-07 LAB — BASIC METABOLIC PANEL
ANION GAP: 10 (ref 5–15)
BUN: 25 mg/dL — ABNORMAL HIGH (ref 6–20)
CALCIUM: 9 mg/dL (ref 8.9–10.3)
CO2: 26 mmol/L (ref 22–32)
Chloride: 101 mmol/L (ref 101–111)
Creatinine, Ser: 0.86 mg/dL (ref 0.44–1.00)
GLUCOSE: 83 mg/dL (ref 65–99)
Potassium: 4 mmol/L (ref 3.5–5.1)
Sodium: 137 mmol/L (ref 135–145)

## 2017-04-07 LAB — CBC WITH DIFFERENTIAL/PLATELET
BASOS ABS: 0 10*3/uL (ref 0.0–0.1)
BASOS PCT: 0 %
Eosinophils Absolute: 0.2 10*3/uL (ref 0.0–0.7)
Eosinophils Relative: 2 %
HEMATOCRIT: 41.8 % (ref 36.0–46.0)
HEMOGLOBIN: 13.9 g/dL (ref 12.0–15.0)
LYMPHS PCT: 21 %
Lymphs Abs: 1.9 10*3/uL (ref 0.7–4.0)
MCH: 28.4 pg (ref 26.0–34.0)
MCHC: 33.3 g/dL (ref 30.0–36.0)
MCV: 85.3 fL (ref 78.0–100.0)
MONO ABS: 0.5 10*3/uL (ref 0.1–1.0)
Monocytes Relative: 5 %
NEUTROS ABS: 6.7 10*3/uL (ref 1.7–7.7)
NEUTROS PCT: 72 %
Platelets: 243 10*3/uL (ref 150–400)
RBC: 4.9 MIL/uL (ref 3.87–5.11)
RDW: 13.9 % (ref 11.5–15.5)
WBC: 9.4 10*3/uL (ref 4.0–10.5)

## 2017-04-07 MED ORDER — HYDROCODONE-ACETAMINOPHEN 5-325 MG PO TABS
2.0000 | ORAL_TABLET | Freq: Once | ORAL | Status: AC
  Administered 2017-04-07: 2 via ORAL
  Filled 2017-04-07: qty 2

## 2017-04-07 MED ORDER — IBUPROFEN 800 MG PO TABS
800.0000 mg | ORAL_TABLET | Freq: Once | ORAL | Status: AC
  Administered 2017-04-07: 800 mg via ORAL

## 2017-04-07 MED ORDER — HYDROCODONE-ACETAMINOPHEN 5-325 MG PO TABS: 1.0000 | ORAL_TABLET | Freq: Four times a day (QID) | ORAL | 0 refills | Status: AC | PRN

## 2017-04-07 NOTE — ED Notes (Signed)
Patient escorted to restroom.  Gait limped and uneven.  Patient crying with pain.

## 2017-04-07 NOTE — ED Notes (Signed)
Pt transported to xray 

## 2017-04-07 NOTE — ED Notes (Signed)
Patient ambulated in hall.  Gait steady, but patient continues to limp and c/o severe pain with weight bearing.  Patient able to ambulate independently.

## 2017-04-07 NOTE — ED Provider Notes (Signed)
[facility]-EMERGENCY DEPT Provider Note   CSN: [id number] Arrival date & time: 04/06/17  2239  By signing my name below, I, Phillips ClimesFabiola de Louis, attest that this documentation has been prepared under the direction and in the presence of Zadie RhineWickline, Summer Parthasarathy, MD . Electronically Signed: Phillips ClimesFabiola de Louis, Scribe. 04/07/2017. 1:47 AM.   History   Chief Complaint Chief Complaint  Patient presents with  . Leg Pain   Felicia Jenkins is a 56 y.o. female with a PMHx of neuralgia who presents to the Emergency Department with complaints of right knee pain which radiates down to her foot, x3 days.    She denies any recent falls, trauma, injury, chest pain or back pain.   Spanish interpretor # [id number] was used.   The history is provided by the patient. A language interpreter was used ([place]).  Leg Pain   This is a new problem. The current episode started 3 to 5 hours ago. The problem occurs constantly. The pain is present in the right lower leg, right foot and right knee. Pertinent negatives include no numbness. The symptoms are aggravated by activity and standing. The treatment provided no relief. There has been no history of extremity trauma.    Past Medical History:  Diagnosis Date  . Hypertension   . Hypertension    untreated  . Obesity   . Shingles     Patient Active Problem List   Diagnosis Date Noted  . DOE (dyspnea on exertion)   . Elevated troponin 08/31/2016  . Post herpetic neuralgia   . Benign essential HTN 05/07/2016  . Depression 03/29/2013  . HLD (hyperlipidemia) 02/02/2013  . HTN (hypertension) 01/29/2013  . Chest pain 01/29/2013    Past Surgical History:  Procedure Laterality Date  . ABDOMINAL HYSTERECTOMY    . APPENDECTOMY    . OVARIAN CYST REMOVAL      OB History    No data available       Home Medications    Prior to Admission medications   Medication Sig Start Date End Date Taking? Authorizing Provider  albuterol (PROVENTIL HFA;VENTOLIN HFA) 108  (90 Base) MCG/ACT inhaler Inhale 2 puffs into the lungs every 4 (four) hours as needed for wheezing or shortness of breath. 12/12/16  Yes Dong, Robin, DO  atorvastatin (LIPITOR) 40 MG tablet Take 1 tablet (40 mg total) by mouth daily. 01/05/17  Yes Cathren LaineSteinl, Kevin, MD  gabapentin (NEURONTIN) 300 MG capsule Take 1 capsule (300 mg total) by mouth 2 (two) times daily. 10/31/16  Yes Arby BarrettePfeiffer, Marcy, MD  metoprolol tartrate (LOPRESSOR) 25 MG tablet Take 25 mg by mouth 2 (two) times daily.   Yes [provider]  aspirin 81 MG EC tablet Take 1 tablet (81 mg total) by mouth daily. Patient not taking: Reported on 01/05/2017 09/02/16   Beaulah DinningGambino, Christina M, MD  atorvastatin (LIPITOR) 40 MG tablet Take 1 tablet (40 mg total) by mouth daily. Patient not taking: Reported on 01/05/2017 10/03/16   Teressa LowerPickering, Vrinda, NP  atorvastatin (LIPITOR) 40 MG tablet Take 1 tablet (40 mg total) by mouth daily. Patient not taking: Reported on 04/07/2017 10/31/16   Arby BarrettePfeiffer, Marcy, MD  docusate sodium (COLACE) 100 MG capsule Take 1 capsule (100 mg total) by mouth every 12 (twelve) hours. Patient not taking: Reported on 01/05/2017 06/14/16   Renne CriglerGeiple, Joshua, PA-C  furosemide (LASIX) 20 MG tablet Take 1 tablet (20 mg total) by mouth daily. Patient not taking: Reported on 01/05/2017 12/12/16   Dwana Melenaong, Robin, DO  gabapentin (NEURONTIN) 300 MG  capsule Take 1 capsule (300 mg total) by mouth 2 (two) times daily. Patient not taking: Reported on 01/05/2017 10/03/16   Teressa Lower, NP  hydrochlorothiazide (HYDRODIURIL) 25 MG tablet Take 1 tablet (25 mg total) by mouth daily. Patient not taking: Reported on 01/05/2017 10/31/16   Arby Barrette, MD  lisinopril-hydrochlorothiazide (ZESTORETIC) 20-12.5 MG tablet Take 1 tablet by mouth daily. Patient not taking: Reported on 01/05/2017 06/02/16   Sherren Mocha, MD  metoprolol succinate (TOPROL XL) 25 MG 24 hr tablet Take 1 tablet (25 mg total) by mouth daily. Patient not taking: Reported on 04/06/2017 09/02/16    Beaulah Dinning, MD  metoprolol succinate (TOPROL-XL) 25 MG 24 hr tablet Take 1 tablet (25 mg total) by mouth daily. Patient not taking: Reported on 01/05/2017 10/31/16   Arby Barrette, MD  metoprolol succinate (TOPROL-XL) 25 MG 24 hr tablet Take 1 tablet (25 mg total) by mouth daily. Patient not taking: Reported on 04/06/2017 01/05/17   Cathren Laine, MD  montelukast (SINGULAIR) 10 MG tablet Take 1 tablet (10 mg total) by mouth at bedtime. Patient not taking: Reported on 01/05/2017 05/04/16   Trena Platt D, PA  traMADol (ULTRAM) 50 MG tablet Take 1 tablet (50 mg total) by mouth every 6 (six) hours as needed. Patient not taking: Reported on 04/06/2017 01/05/17   Cathren Laine, MD    Family History Family History  Problem Relation Age of Onset  . Arthritis Mother   . Leukemia Father     Social History Social History  Substance Use Topics  . Smoking status: Never Smoker  . Smokeless tobacco: Never Used  . Alcohol use No     Allergies   Penicillins   Review of Systems Review of Systems  Respiratory: Negative for shortness of breath.   Musculoskeletal: Positive for arthralgias and myalgias.  Neurological: Negative for numbness.  All other systems reviewed and are negative.    Physical Exam Updated Vital Signs BP (!) 183/88   Pulse (!) 54   Temp 98.5 F (36.9 C) (Oral)   Resp 18   Ht 5\' 6"  (1.676 m)   Wt 228 lb (103.4 kg)   SpO2 96%   BMI 36.80 kg/m   Physical Exam CONSTITUTIONAL: Well developed/well nourished. Moaning in pain HEAD: Normocephalic/atraumatic EYES: EOMI/PERRL ENMT: Mucous membranes moist NECK: supple no meningeal signs CV: S1/S2 noted, no murmurs/rubs/gallops noted LUNGS: Lungs are clear to auscultation bilaterally, no apparent distress ABDOMEN: soft, nontender NEURO: Pt is awake/alert/appropriate, moves all extremitiesx4.  No facial droop.   EXTREMITIES: pulses normal/equal, full ROM. Tenderness to palpation of right knee and right tibial  surface. No deformities. No erythema. No edema. Distal pulses intact. Full range of motion noted of right lower extremity.  SKIN: warm, color normal PSYCH: anxious and moaning in pain   ED Treatments / Results  DIAGNOSTIC STUDIES: Oxygen Saturation is 100% on RA, nl by my interpretation.    COORDINATION OF CARE: 12:55 AM Discussed treatment plan with pt and husband at bedside. They agreed to plan.  Labs (all labs ordered are listed, but only abnormal results are displayed) Labs Reviewed  BASIC METABOLIC PANEL - Abnormal; Notable for the following:       Result Value   BUN 25 (*)    All other components within normal limits  CBC WITH DIFFERENTIAL/PLATELET   EKG  EKG Interpretation None      Radiology Dg Tibia/fibula Right  Result Date: 04/07/2017 CLINICAL DATA:  Nontraumatic right lower extremity pain for 2 days  EXAM: RIGHT TIBIA AND FIBULA - 2 VIEW COMPARISON:  None. FINDINGS: Negative for fracture or dislocation. There is osteoarthritis at the knee. No bone lesion or bony destruction. No significant soft tissue abnormality. IMPRESSION: No acute findings.  Right knee osteoarthritis. Electronically Signed   By: Ellery Plunk M.D.   On: 04/07/2017 01:44   Dg Knee Complete 4 Views Right  Result Date: 04/07/2017 CLINICAL DATA:  Nontraumatic right lower extremity pain for 2 days. EXAM: RIGHT KNEE - COMPLETE 4+ VIEW COMPARISON:  None. FINDINGS: Negative for acute fracture or dislocation. There are moderately severe osteoarthritic changes involving all compartments, greatest in the medial compartment. No bone lesion or bony destruction. No significant soft tissue abnormality. IMPRESSION: Osteoarthritis.  No acute findings. Electronically Signed   By: Ellery Plunk M.D.   On: 04/07/2017 01:44    Procedures Procedures   Medications Ordered in ED Medications  HYDROcodone-acetaminophen (NORCO/VICODIN) 5-325 MG per tablet 2 tablet (2 tablets Oral Given 04/07/17 0150)  ibuprofen  (ADVIL,MOTRIN) tablet 800 mg (800 mg Oral Given 04/07/17 0502)    Initial Impression / Assessment and Plan / ED Course  I have reviewed the triage vital signs and the nursing notes.  Pertinent labs & imaging results that were available during my care of the patient were reviewed by me and considered in my medical decision making (see chart for details).     Pt stable She appears improved She is ambulatory Pulses intact No signs of cellulitis No visible trauma She still reports knee pain and some right calf pain Difficult exam due to body habitus I can not rule out DVT  Utilized interpreter [phone] for spanish Advised to return to ER later this morning for DVT ultrasound Will defer anticoagulation for now She is requesting pain meds Narcotic database reviewed and considered in decision making Short course of vicodin ordered   Final Clinical Impressions(s) / ED Diagnoses   Final diagnoses:  Right leg pain    New Prescriptions New Prescriptions   No medications on file   I personally performed the services described in this documentation, which was scribed in my presence. The recorded information has been reviewed and is accurate.       Zadie Rhine, MD 04/07/17 [phone]
